# Patient Record
Sex: Female | Born: 1995 | Hispanic: Yes | Marital: Single
Health system: Southern US, Community
[De-identification: ages and names within clinical notes are randomized; demographics above are authoritative.]

## PROBLEM LIST (undated history)

## (undated) HISTORY — PX: MOLE REMOVAL: SHX2046

---

## 2018-04-06 DIAGNOSIS — E663 Overweight: Secondary | ICD-10-CM | POA: Insufficient documentation

## 2018-04-06 LAB — HM HIV SCREENING LAB: HM HIV Screening: NEGATIVE

## 2019-02-08 ENCOUNTER — Telehealth: Payer: Self-pay | Admitting: *Deleted

## 2019-02-08 DIAGNOSIS — Z20822 Contact with and (suspected) exposure to covid-19: Secondary | ICD-10-CM

## 2019-02-08 NOTE — Telephone Encounter (Signed)
Assisted by Sussane, Interpreter # [id number]; contacted pt to schedule testing; pt accepted appointment at [facility] site 02/11/2019 at 0830; pt given address, location, and instructions that she and all occupants of her vehicle should wear masks; she verbalized understanding; orders placed per protocol. 

## 2019-02-08 NOTE — Telephone Encounter (Signed)
Glenda Linens, RN from [facility] called to have pt tested for COVID due to exposure; pt address, DOB, and phone number confirmed [phone]; will attempt to contact pt.

## 2019-02-11 ENCOUNTER — Other Ambulatory Visit: Payer: Self-pay

## 2019-02-11 DIAGNOSIS — Z20822 Contact with and (suspected) exposure to covid-19: Secondary | ICD-10-CM

## 2019-05-17 ENCOUNTER — Ambulatory Visit: Payer: Self-pay

## 2019-05-31 DIAGNOSIS — E663 Overweight: Secondary | ICD-10-CM

## 2019-06-03 ENCOUNTER — Ambulatory Visit: Payer: Self-pay

## 2019-06-05 ENCOUNTER — Other Ambulatory Visit: Payer: Self-pay

## 2019-06-05 ENCOUNTER — Encounter: Payer: Self-pay | Admitting: Family Medicine

## 2019-06-05 ENCOUNTER — Ambulatory Visit (LOCAL_COMMUNITY_HEALTH_CENTER): Payer: Self-pay | Admitting: Family Medicine

## 2019-06-05 VITALS — BP 110/71 | Ht <= 58 in | Wt 135.0 lb

## 2019-06-05 DIAGNOSIS — Z30017 Encounter for initial prescription of implantable subdermal contraceptive: Secondary | ICD-10-CM

## 2019-06-05 DIAGNOSIS — Z113 Encounter for screening for infections with a predominantly sexual mode of transmission: Secondary | ICD-10-CM

## 2019-06-05 DIAGNOSIS — Z3009 Encounter for other general counseling and advice on contraception: Secondary | ICD-10-CM

## 2019-06-05 MED ORDER — ETONOGESTREL 68 MG ~~LOC~~ IMPL
68.0000 mg | DRUG_IMPLANT | Freq: Once | SUBCUTANEOUS | Status: AC
  Administered 2019-06-05: 12:00:00 68 mg via SUBCUTANEOUS

## 2019-06-05 NOTE — Progress Notes (Signed)
Nexplanon Insertion Procedure Patient identified, informed consent performed, consent signed.   Patient does understand that irregular bleeding is a very common side effect of this medication. She was advised to have backup contraception after placement. Patient was determined to meet WHO criteria for not being pregnant. Appropriate time out taken.  The insertion site was identified 8-10 cm (3-4 inches) from the medial epicondyle of the humerus and 3-5 cm (1.25-2 inches) posterior to (below) the sulcus (groove) between the biceps and triceps muscles of the patient's L arm and marked.  Patient was prepped with alcohol swab and then injected with 3 ml of 1% lidocaine.  Arm was prepped with chlorhexidene, Nexplanon removed from packaging,  Device confirmed in needle, then inserted full length of needle and withdrawn per handbook instructions. Nexplanon was able to palpated in the patient's arm; patient palpated the insert  herself. There was minimal blood loss.  Patient insertion site covered with guaze and a pressure bandage to reduce any bruising.  The patient tolerated the procedure well and was given post procedure instructions.  Nexplanon:   Counseled patient to take OTC analgesic starting as soon as lidocaine starts to wear off and take regularly for at least 48 hr to decrease discomfort.  Specifically to take with food or milk to decrease stomach upset and for IB 600 mg (3 tablets) every 6 hrs; IB 800 mg (4 tablets) every 8 hrs; or Aleve 2 tablets every 12 hrs.

## 2019-06-05 NOTE — Progress Notes (Signed)
Patient in clinic today requesting Nexplanon insertion. Patient had her expired Nexplanon removed 3 months ago in [place] and received Depo. Would like HIV/RPR testing today also. Aileen Fass, RN

## 2019-09-03 ENCOUNTER — Telehealth: Payer: Self-pay | Admitting: Family Medicine

## 2019-09-03 NOTE — Telephone Encounter (Signed)
PATIENT IS HAVING ISSUES WITH NEXPLANON AND WANTS IT REMOVED.

## 2019-09-06 NOTE — Telephone Encounter (Signed)
TC with patient. Reports Neplanon is giving her headaches and wants it removed.  Stated that when she had the last implant removed headaches improved also.  Patient would like device removed and use pills. Appt scheduled. Richmond Campbell, RN

## 2019-09-11 ENCOUNTER — Ambulatory Visit (LOCAL_COMMUNITY_HEALTH_CENTER): Payer: Self-pay | Admitting: Advanced Practice Midwife

## 2019-09-11 ENCOUNTER — Encounter: Payer: Self-pay | Admitting: Advanced Practice Midwife

## 2019-09-11 ENCOUNTER — Other Ambulatory Visit: Payer: Self-pay

## 2019-09-11 VITALS — BP 108/75 | Ht <= 58 in | Wt 143.6 lb

## 2019-09-11 DIAGNOSIS — E6609 Other obesity due to excess calories: Secondary | ICD-10-CM

## 2019-09-11 DIAGNOSIS — Z30011 Encounter for initial prescription of contraceptive pills: Secondary | ICD-10-CM

## 2019-09-11 DIAGNOSIS — E669 Obesity, unspecified: Secondary | ICD-10-CM | POA: Insufficient documentation

## 2019-09-11 DIAGNOSIS — Z3009 Encounter for other general counseling and advice on contraception: Secondary | ICD-10-CM

## 2019-09-11 DIAGNOSIS — G4452 New daily persistent headache (NDPH): Secondary | ICD-10-CM

## 2019-09-11 MED ORDER — NORETHINDRONE 0.35 MG PO TABS: 1.0000 | ORAL_TABLET | Freq: Every day | ORAL | 0 refills | Status: DC

## 2019-09-11 NOTE — Progress Notes (Signed)
   [facility] problem [facility]  [facility]  Subjective:  Latoya Potter is a 24 y.o. G1P1 SHF nonsmoker being seen today for Nexplanon removal due to daily "bad" h/a since Nexplanon inserted on 06/05/19 and has no partner now.  Takes 2 ES Tylenol q 4 hrs for frontal h/a with N&V, sensitivity to light, denies neuro sxs.  LMP 2017.  Last sex 08/24/19`.  Chief Complaint  Patient presents with  . Contraception    Nexplanon removal    HPI Wants Nexplanon removed because c/o daily h/a.  This is her second Nexplanon and she had h/a with her first one as well. Last PE 04/06/18 Does the patient have a current or past history of drug use? No   No components found for: HCV]   Health Maintenance Due  Topic Date Due  . TETANUS/TDAP  05/31/2017  . INFLUENZA VACCINE  03/23/2019  . PAP-Cervical Cytology Screening  06/01/2019  . PAP SMEAR-Modifier  06/01/2019    ROS  The following portions of the patient's history were reviewed and updated as appropriate: allergies, current medications, past family history, past medical history, past social history, past surgical history and problem list. Problem list updated.   See flowsheet for other program required questions.  Objective:   Vitals:   09/11/19 1541  BP: 108/75  Weight: 143 lb 9.6 oz (65.1 kg)  Height: 4\' 10"  (1.473 m)    Physical Exam  n/a  Assessment and Plan:  is a 24 y.o. female presenting to the [facility] Department for a Women's Health problem visit  1. Obesity due to excess calories, unspecified classification, unspecified whether serious comorbidity present 2.  Family planning Micronor #3 I po daily to begin today Counseled abstinance next 7 days Needs pap at next appt .achdNexplanon Removal Patient identified, informed consent performed, consent signed.   Appropriate time out taken. Nexplanon site identified.  Area prepped in usual sterile  fashon. 3 ml of 1% lidocaine with Epinephrine was used to anesthetize the area at the distal end of the implant and along implant site. A small stab incision was made right beside the implant on the distal portion.  The Nexplanon rod was grasped using straight hemostats/manual and removed without difficulty.  There was minimal blood loss. There were no complications.  Steri-strips were applied over the small incision.  A pressure bandage was applied to reduce any bruising.  The patient tolerated the procedure well and was given post procedure instructions.    Nexplanon:   Counseled patient to take OTC analgesic starting as soon as lidocaine starts to wear off and take regularly for at least 48 hr to decrease discomfort.  Specifically to take with food or milk to decrease stomach upset and for IB 600 mg (3 tablets) every 6 hrs; IB 800 mg (4 tablets) every 8 hrs; or Aleve 2 tablets every 12 hrs.     No follow-ups on file.  No future appointments.  [facility], CNM

## 2019-09-11 NOTE — Progress Notes (Signed)
Consent signed, OCP dispensed, condoms given. Provider orders completed.

## 2019-09-11 NOTE — Progress Notes (Signed)
Last sex was about 4 weeks ago. Pt desires nexplanon removal and to start OCP. Pt states she did not have any problems with headaches before getting Nexplanon.

## 2019-12-23 ENCOUNTER — Ambulatory Visit: Payer: Self-pay

## 2020-02-28 ENCOUNTER — Encounter: Payer: Self-pay | Admitting: Advanced Practice Midwife

## 2020-02-28 ENCOUNTER — Ambulatory Visit (LOCAL_COMMUNITY_HEALTH_CENTER): Payer: Self-pay | Admitting: Advanced Practice Midwife

## 2020-02-28 ENCOUNTER — Other Ambulatory Visit: Payer: Self-pay | Admitting: Advanced Practice Midwife

## 2020-02-28 ENCOUNTER — Other Ambulatory Visit: Payer: Self-pay

## 2020-02-28 VITALS — BP 116/79 | Ht <= 58 in | Wt 137.6 lb

## 2020-02-28 DIAGNOSIS — Z3009 Encounter for other general counseling and advice on contraception: Secondary | ICD-10-CM

## 2020-02-28 DIAGNOSIS — Z30013 Encounter for initial prescription of injectable contraceptive: Secondary | ICD-10-CM

## 2020-02-28 LAB — PREGNANCY, URINE: Preg Test, Ur: NEGATIVE

## 2020-02-28 LAB — WET PREP FOR TRICH, YEAST, CLUE
Trichomonas Exam: NEGATIVE
Yeast Exam: NEGATIVE

## 2020-02-28 MED ORDER — MULTIVITAMINS PO CAPS: 1.0000 | ORAL_CAPSULE | Freq: Every day | ORAL | 0 refills | Status: AC

## 2020-02-28 MED ORDER — MEDROXYPROGESTERONE ACETATE 150 MG/ML IM SUSP
150.0000 mg | INTRAMUSCULAR | Status: AC
  Administered 2020-02-28 – 2020-07-29 (×2): 150 mg via INTRAMUSCULAR

## 2020-02-28 NOTE — Progress Notes (Signed)
[employer] results reviewed. No treatment per standing orders indicated. PT negative. Depo given and tolerated well. MVI's and condoms accepted. Tawny Hopping, RN

## 2020-02-28 NOTE — Progress Notes (Signed)
Family Planning Visit- Initial Visit  Subjective:  Latoya Potter is a 24 y.o. SHF G1P1001 exvaper  being seen today for an initial well woman visit and to discuss family planning options.  She is currently using nothing for pregnancy prevention. Patient reports she does not want a pregnancy in the next year.  Patient has the following medical conditions has Overweight; Obesity BMI=30.0; and New daily persistent headache on their problem list.  Chief Complaint  Patient presents with  . Contraception    Physical and Depo    Patient reports last sex yesterday without condom; with current partner x 6 years.  LMP 02/11/20.  Nexplanon inserted 12/01/15 and then 06/05/19 and removed 09/11/19 and was given #3 Micronor then.  Pt states ran out of Micronor 5 mo ago and wants DMPA  Patient denies MJ use  Body mass index is 28.76 kg/m. - Patient is eligible for diabetes screening based on BMI and age >4?  not applicable HA1C ordered? not applicable  Patient reports 1 of partners in last year. Desires STI screening?  Yes  Has patient been screened once for HCV in the past?  No  No results found for: HCVAB  Does the patient have current of drug use, have a partner with drug use, and/or has been incarcerated since last result? No  If yes-- Screen for HCV through [facility]   Does the patient meet criteria for HBV testing? No  Criteria:  -Household, sexual or needle sharing contact with HBV -History of drug use -HIV positive -Those with known Hep C   Health Maintenance Due  Topic Date Due  . Hepatitis C Screening  Never done  . COVID-19 Vaccine (1) Never done  . TETANUS/TDAP  Never done  . PAP-Cervical Cytology Screening  Never done  . PAP SMEAR-Modifier  Never done    Review of Systems  All other systems reviewed and are negative.   The following portions of the patient's history were reviewed and updated as appropriate: allergies, current medications, past  family history, past medical history, past social history, past surgical history and problem list. Problem list updated.   See flowsheet for other program required questions.  Objective:   Vitals:   02/28/20 1552  BP: 116/79  Weight: 137 lb 9.6 oz (62.4 kg)  Height: 4\' 10"  (1.473 m)    Physical Exam Constitutional:      Appearance: Normal appearance. She is normal weight.  HENT:     Head: Normocephalic and atraumatic.     Mouth/Throat:     Mouth: Mucous membranes are moist.  Eyes:     Conjunctiva/sclera: Conjunctivae normal.  Cardiovascular:     Rate and Rhythm: Normal rate and regular rhythm.  Pulmonary:     Effort: Pulmonary effort is normal.     Breath sounds: Normal breath sounds.  Chest:     Breasts:        Right: Normal.        Left: Normal.  Abdominal:     Palpations: Abdomen is soft.     Comments: Soft without masses or tenderness  Genitourinary:    General: Normal vulva.     Exam position: Lithotomy position.     Vagina: Vaginal discharge (white creamy leukorrhea, ph<4.5) present.     Cervix: Normal.     Uterus: Normal.      Adnexa: Right adnexa normal and left adnexa normal.     Rectum: Normal.     Comments: Pap done Musculoskeletal:  General: Normal range of motion.     Cervical back: Normal range of motion and neck supple.  Skin:    General: Skin is warm and dry.  Neurological:     Mental Status: She is alert.  Psychiatric:        Mood and Affect: Mood normal.       Assessment and Plan:  Everardo All is a 24 y.o. female presenting to the [facility] Department for an initial well woman exam/family planning visit  Contraception counseling: Reviewed all forms of birth control options in the tiered based approach. available including abstinence; over the counter/barrier methods; hormonal contraceptive medication including pill, patch, ring, injection,contraceptive implant, ECP; hormonal and nonhormonal IUDs;  permanent sterilization options including vasectomy and the various tubal sterilization modalities. Risks, benefits, and typical effectiveness rates were reviewed.  Questions were answered.  Written information was also given to the patient to review.  Patient desires DMPA, this was prescribed for patient. She will follow up in 11-13 wks for surveillance.  She was told to call with any further questions, or with any concerns about this method of contraception.  Emphasized use of condoms 100% of the time for STI prevention.  Patient was offered ECP. ECP was not accepted by the patient. ECP counseling was not given - see RN documentation  1. Family planning Treat wet mount per standing orders Immunization nurse consult - Syphilis Serology, [place] [facility] - HIV [place] [facility] - Chlamydia/Gonorrhea [place] [facility]  2. Encounter for initial prescription of injectable contraceptive If PT neg today may have DMPA 150 mg IM q 11-13 wks x 1 year per pt request Pt declines ECP Please counsel pt abstinance/backup condoms next 7 days Pt counseled to do PT 03/13/20 and to call us if + - Pregnancy, urine - WET PREP FOR TRICH, YEAST, CLUE     No follow-ups on file.  No future appointments.  Alberteen Spindle, CNM

## 2020-02-28 NOTE — Progress Notes (Signed)
Here today for PE and Depo. Last PE here was 04/06/2018. Had Nexplanon removed 09/11/2019. No Pap Smear records. Wants all STD screening today. Tawny Hopping, RN

## 2020-03-03 LAB — PAP IG (IMAGE GUIDED): PAP Smear Comment: 0

## 2020-07-01 ENCOUNTER — Ambulatory Visit: Payer: Self-pay

## 2020-07-29 ENCOUNTER — Other Ambulatory Visit: Payer: Self-pay

## 2020-07-29 ENCOUNTER — Encounter: Payer: Self-pay | Admitting: Physician Assistant

## 2020-07-29 ENCOUNTER — Ambulatory Visit (LOCAL_COMMUNITY_HEALTH_CENTER): Payer: Self-pay | Admitting: Physician Assistant

## 2020-07-29 VITALS — BP 130/72 | Ht <= 58 in | Wt 144.0 lb

## 2020-07-29 DIAGNOSIS — Z30013 Encounter for initial prescription of injectable contraceptive: Secondary | ICD-10-CM

## 2020-07-29 DIAGNOSIS — Z299 Encounter for prophylactic measures, unspecified: Secondary | ICD-10-CM

## 2020-07-29 DIAGNOSIS — Z3009 Encounter for other general counseling and advice on contraception: Secondary | ICD-10-CM

## 2020-07-29 DIAGNOSIS — Z113 Encounter for screening for infections with a predominantly sexual mode of transmission: Secondary | ICD-10-CM

## 2020-07-29 DIAGNOSIS — Z3042 Encounter for surveillance of injectable contraceptive: Secondary | ICD-10-CM

## 2020-07-29 LAB — PREGNANCY, URINE: Preg Test, Ur: NEGATIVE

## 2020-07-29 LAB — WET PREP FOR TRICH, YEAST, CLUE
Trichomonas Exam: NEGATIVE
Yeast Exam: NEGATIVE

## 2020-07-29 MED ORDER — CLOTRIMAZOLE 1 % VA CREA: 1.0000 | TOPICAL_CREAM | Freq: Every day | VAGINAL | 0 refills | Status: DC

## 2020-07-29 NOTE — Progress Notes (Signed)
UPT is negative today. Wet mount reviewed with provider and is negative as well. Pt received Clotrimazole Vaginal cream per Sadie Haber, PA written and verbal order. Pt received Depo 150mg  IM today per , PA verbal order and per Sadie Haber, CNM order from 02/28/2020. Pt tolerated well. Reminder card of when next Depo will be due given to pt if pt decides to continue with Depo, and pt aware to give 04/30/2020 a call around that time so she can RTC for Depo if pt desires to continue with Depo. Provider orders completed.

## 2020-07-29 NOTE — Progress Notes (Signed)
Pt is here for FP Problem visit and desires UPT, STI screening and has concerns about her breasts having some discharge from nipples and tenderness. Pt's last Depo was 02/28/2020, so pt is 21 weeks and 5 days post per last Depo. Pt reports her periods have never been normal and LMP was ~5 months ago. Pt reports currently using condoms sometimes as BCM but is wanting to get on a birth control method today. Pt reports last sex was ~3 weeks ago without condom.

## 2020-07-31 NOTE — Progress Notes (Signed)
[facility]  [facility]  Subjective:  Everardo All is a 24 y.o. being seen today to talk about [facility] options and desires a STD screening.  Chief Complaint  Patient presents with  . Contraception    UPT and STI screening    HPI  Patient is late for Depo (21w 5 d) and states that she desires to become pregnant within the next year.  Reports that she has had some vaginal itching sometimes that will wake her from sleep.  Denies other symptoms.  Reports last sex was 3 weeks ago.   Does the patient have a current or past history of drug use? No   No components found for: HCV]   Health Maintenance Due  Topic Date Due  . Hepatitis C Screening  Never done  . COVID-19 Vaccine (1) Never done  . TETANUS/TDAP  Never done  . INFLUENZA VACCINE  Never done    Review of Systems  All other systems reviewed and are negative.   The following portions of the patient's history were reviewed and updated as appropriate: allergies, current medications, past family history, past medical history, past social history, past surgical history and problem list. Problem list updated.   See flowsheet for other program required questions.  Objective:   Vitals:   07/29/20 1058  BP: 130/72  Weight: 144 lb (65.3 kg)  Height: 4\' 10"  (1.473 m)    Physical Exam Vitals and nursing note reviewed.  Constitutional:      General: She is not in acute distress.    Appearance: She is normal weight.  HENT:     Head: Normocephalic and atraumatic.  Eyes:     Conjunctiva/sclera: Conjunctivae normal.  Pulmonary:     Effort: Pulmonary effort is normal.  Abdominal:     Palpations: Abdomen is soft. There is no mass.     Tenderness: There is no abdominal tenderness. There is no guarding or rebound.  Genitourinary:    General: Normal vulva.     Rectum: Normal.     Comments: External genitalia/pubic area without nits, lice, edema, erythema,  lesions and inguinal adenopathy. Vagina with normal mucosa and discharge. Cervix without visible lesions. Uterus firm, mobile, nt, no masses, no CMT, no adnexal tenderness or fullness. Musculoskeletal:     Cervical back: Neck supple.  Lymphadenopathy:     Cervical: No cervical adenopathy.  Skin:    General: Skin is warm and dry.  Neurological:     Mental Status: She is alert and oriented to person, place, and time.  Psychiatric:        Mood and Affect: Mood normal.        Behavior: Behavior normal.        Thought Content: Thought content normal.        Judgment: Judgment normal.       Assessment and Plan:  is a 24 y.o. female presenting to the [facility] Department for a Women's Health problem visit  1. Encounter for counseling regarding contraception Counseled patient re:  Risks, benefits and SE of all BCMs. Patient counseled extensively that after Depo wears off, it can take up to 18 months before periods return to regular pattern so it can extend the time it can take for her to get pregnant after Depo stops working. Patient states that she would like to restart Depo today if her PT is negative. - Pregnancy, urine  2. Screening for  STD (sexually transmitted disease) Wet mount sent to lab for reading. Reviewed results of wet mount. - WET PREP FOR TRICH, YEAST, CLUE  3. Surveillance for Depo-Provera contraception Patient may have Depo today per previous order. Enc to use condoms as back up for 2 weeks after shot today.   4. Prophylactic measure Give Clotrimazole 1 % vaginal cream to use 1 app qhs for 7 days in a row to see if that resolves itching. - clotrimazole (CLOTRIMAZOLE-7) 1 % vaginal cream; Place 1 Applicatorful vaginally at bedtime.  Dispense: 45 g; Refill: 0     Return in about 11 weeks (around 10/14/2020) for Depo, annual and PRN.  No future appointments.  Matt Holmes, PA

## 2021-01-20 ENCOUNTER — Encounter: Payer: Self-pay | Admitting: Physician Assistant

## 2021-01-20 ENCOUNTER — Other Ambulatory Visit: Payer: Self-pay

## 2021-01-20 ENCOUNTER — Ambulatory Visit (LOCAL_COMMUNITY_HEALTH_CENTER): Payer: Self-pay | Admitting: Physician Assistant

## 2021-01-20 VITALS — Ht <= 58 in | Wt 145.0 lb

## 2021-01-20 DIAGNOSIS — Z113 Encounter for screening for infections with a predominantly sexual mode of transmission: Secondary | ICD-10-CM

## 2021-01-20 DIAGNOSIS — N739 Female pelvic inflammatory disease, unspecified: Secondary | ICD-10-CM

## 2021-01-20 DIAGNOSIS — Z3009 Encounter for other general counseling and advice on contraception: Secondary | ICD-10-CM

## 2021-01-20 LAB — PREGNANCY, URINE: Preg Test, Ur: NEGATIVE

## 2021-01-20 LAB — WET PREP FOR TRICH, YEAST, CLUE
Trichomonas Exam: NEGATIVE
Yeast Exam: NEGATIVE

## 2021-01-20 MED ORDER — CEFTRIAXONE SODIUM 500 MG IJ SOLR
500.0000 mg | Freq: Once | INTRAMUSCULAR | Status: AC
  Administered 2021-01-20: 500 mg via INTRAMUSCULAR

## 2021-01-20 MED ORDER — AZITHROMYCIN 500 MG PO TABS
1000.0000 mg | ORAL_TABLET | ORAL | Status: AC
  Administered 2021-01-20 – 2021-01-27 (×2): 1000 mg via ORAL

## 2021-01-20 MED ORDER — METRONIDAZOLE 500 MG PO TABS: 500.0000 mg | ORAL_TABLET | Freq: Two times a day (BID) | ORAL | 0 refills | Status: AC

## 2021-01-20 NOTE — Progress Notes (Signed)
[facility] problem [facility]  [facility]  Subjective:  Latoya Potter is a 25 y.o. being seen today for pelvic exam.  Chief Complaint  Patient presents with  . Contraception    Pelvic exam     HPI  Patient reports that she has had lower abdominal pain for about 3 weeks, sometimes resolves and when it comes back has a "pulsing" quality but usually has sharp pains.  Reports that she has had some nausea and has constipation.  Denies changes to bowel or bladder function other than constipation.  Denies fever and chills.  Reports white vaginal discharge without itching, burning or odor.  Reports that she has had some breast tenderness and expressed clear discharge from nipples.  Per chart review, s/p Depo at 25 weeks today.   Does the patient have a current or past history of drug use? No   No components found for: HCV]   Health Maintenance Due  Topic Date Due  . COVID-19 Vaccine (1) Never done  . HPV VACCINES (1 - 2-dose series) Never done  . Hepatitis C Screening  Never done  . TETANUS/TDAP  Never done    Review of Systems  All other systems reviewed and are negative.   The following portions of the patient's history were reviewed and updated as appropriate: allergies, current medications, past family history, past medical history, past social history, past surgical history and problem list. Problem list updated.   See flowsheet for other program required questions.  Objective:   Vitals:   01/20/21 1000  Weight: 145 lb (65.8 kg)  Height: 4\' 10"  (1.473 m)    Physical Exam Vitals and nursing note reviewed.  Constitutional:      General: She is not in acute distress.    Appearance: Normal appearance.  HENT:     Head: Normocephalic and atraumatic.  Eyes:     Conjunctiva/sclera: Conjunctivae normal.  Pulmonary:     Effort: Pulmonary effort is normal.  Abdominal:     Palpations: Abdomen is soft. There is no mass.      Tenderness: There is no abdominal tenderness. There is no guarding or rebound.  Genitourinary:    General: Normal vulva.     Rectum: Normal.     Comments: External genitalia/pubic area without nits, lice, edema, erythema, lesions and inguinal adenopathy. Vagina with normal mucosa and moderate amount of thin, white discharge, pH= 4.5. Cervix without visible lesions, but friable on exam. Uterus firm, mobile, with tenderness to palpation and movement, + CMT, no adnexal tenderness or fullness. Musculoskeletal:     Cervical back: Neck supple. No tenderness.  Lymphadenopathy:     Cervical: No cervical adenopathy.  Skin:    General: Skin is warm and dry.     Findings: No bruising, erythema, lesion or rash.  Neurological:     Mental Status: She is alert and oriented to person, place, and time.  Psychiatric:        Mood and Affect: Mood normal.        Behavior: Behavior normal.        Thought Content: Thought content normal.        Judgment: Judgment normal.       Assessment and Plan:  Latoya Potter is a 25 y.o. female presenting to the [facility] Department for a Women's Health problem visit  1. Encounter for counseling regarding contraception Patient into clinic stating initially that she would like to restart Depo  but changes mind and decides to use condoms with all sex. Pregnancy test is negative today.  Counseled that negative today means that she was not pregnant [redacted] weeks ago. Counseled patient that breast tenderness is most likely due to hormone changes with Depo wearing off and that she should limit stimulation to her breasts. - Pregnancy, urine  2. Screening for STD (sexually transmitted disease) Await test results.  Counseled that RN will call if needs to RTC for further treatment. - WET PREP FOR TRICH, YEAST, CLUE - Chlamydia/Gonorrhea [place] [facility]  3. Pelvic inflammatory disease Treat for PID with Ceftriaxone 500 mg IM, Metronidazole  500 mg #28 1 po BID for 14 days and Azithromycin 1 g po DOT today. Counsel patient no sex until after she and her partner have completed treatment. RTC in 1 week for pelvic recheck and second dose of Azithromycin. Call with questions or concerns. Rec that patient go to ER if has worsening abdominal pain, nausea, vomiting, fever or chills. - cefTRIAXone (ROCEPHIN) injection 500 mg - metroNIDAZOLE (FLAGYL) 500 MG tablet; Take 1 tablet (500 mg total) by mouth 2 (two) times daily for 14 days.  Dispense: 28 tablet; Refill: 0 - azithromycin (ZITHROMAX) tablet 1,000 mg     Return in 1 week (on 01/27/2021) for PID recheck and medication.  Future Appointments  Date Time Provider Department Center  01/27/2021  3:40 PM [facility] PROVIDER [facility] None    Matt Holmes, PA

## 2021-01-20 NOTE — Progress Notes (Signed)
Wet mount and UPT reviewed by provider. Pt treated for PID per provider orders. Provider orders completed.

## 2021-01-20 NOTE — Progress Notes (Signed)
Pt had physical and pap at [facility] 02/28/20. Last depo 07/29/20 in left deltoid; 25.0 weeks post depo today. Pt c/o pain in right lower abdomen, usually while walking; sometimes pain so bad she feels like she could pass out but Tylenol does help. Both breasts are sore. Would like STD screening and would like to start depo if not pregnant.

## 2021-01-27 ENCOUNTER — Other Ambulatory Visit: Payer: Self-pay

## 2021-01-27 ENCOUNTER — Ambulatory Visit: Payer: Self-pay | Admitting: Physician Assistant

## 2021-01-27 DIAGNOSIS — N739 Female pelvic inflammatory disease, unspecified: Secondary | ICD-10-CM

## 2021-01-27 DIAGNOSIS — Z09 Encounter for follow-up examination after completed treatment for conditions other than malignant neoplasm: Secondary | ICD-10-CM

## 2021-01-27 NOTE — Progress Notes (Signed)
1 gram Azithromycin po DOT given per provider orders. Provider orders completed.

## 2021-01-28 NOTE — Progress Notes (Signed)
S:  patient into clinic for PID recheck.  Reports that she is feeling a lot better.  States that she has had diarrhea associated with taking her antibiotics.  Patient asks how/why she got PID. O:  WDWN female in NAD, A&O x 3, normal work of breathing.  Abdomen=soft, nt,no masses, no guarding, or rebound; bimanual=uterus normal size, firm, mobile,nt,no CMT, no adnexal tenderness or fullness. A/P:  1.  PID resolving with treatment. 2.  Counseled patient about PID and that it happens when a bacteria goes through the cervix and into the uterus.  3.  Reassured that it can happen to many women and that she should not douche or otherwise wash inside her vagina to prevent PID. 4.  Give patient second dose of Azithromycin 1 g po DOT today. 5.  Counseled patient to complete antibiotics as directed and that diarrhea is a common SE of antibiotics. 6.  Suggested that patient may want to start an OTC probiotic 2-3 days prior to completing her antibiotics and take for 30 days to help restore gut flora. 7.  No sex until after medicine completed and partner completes treatment. 8.  RTC prn.

## 2021-02-22 ENCOUNTER — Other Ambulatory Visit: Payer: Self-pay

## 2021-02-22 ENCOUNTER — Encounter: Payer: Self-pay | Admitting: *Deleted

## 2021-02-22 DIAGNOSIS — B373 Candidiasis of vulva and vagina: Secondary | ICD-10-CM | POA: Insufficient documentation

## 2021-02-22 DIAGNOSIS — F17290 Nicotine dependence, other tobacco product, uncomplicated: Secondary | ICD-10-CM | POA: Insufficient documentation

## 2021-02-22 DIAGNOSIS — R891 Abnormal level of hormones in specimens from other organs, systems and tissues: Secondary | ICD-10-CM | POA: Insufficient documentation

## 2021-02-22 LAB — COMPREHENSIVE METABOLIC PANEL
ALT: 17 U/L (ref 0–44)
AST: 19 U/L (ref 15–41)
Albumin: 4.3 g/dL (ref 3.5–5.0)
Alkaline Phosphatase: 51 U/L (ref 38–126)
Anion gap: 9 (ref 5–15)
BUN: 12 mg/dL (ref 6–20)
CO2: 22 mmol/L (ref 22–32)
Calcium: 9.3 mg/dL (ref 8.9–10.3)
Chloride: 107 mmol/L (ref 98–111)
Creatinine, Ser: 0.58 mg/dL (ref 0.44–1.00)
GFR, Estimated: >60 mL/min (ref >60)
Glucose, Bld: 98 mg/dL (ref 70–99)
Potassium: 3.6 mmol/L (ref 3.5–5.1)
Sodium: 138 mmol/L (ref 135–145)
Total Bilirubin: 0.6 mg/dL (ref 0.3–1.2)
Total Protein: 7.6 g/dL (ref 6.5–8.1)

## 2021-02-22 LAB — URINALYSIS, COMPLETE (UACMP) WITH MICROSCOPIC
Bilirubin Urine: NEGATIVE
Glucose, UA: NEGATIVE mg/dL
Ketones, ur: NEGATIVE mg/dL
Nitrite: NEGATIVE
Protein, ur: NEGATIVE mg/dL
Specific Gravity, Urine: 1.015 (ref 1.005–1.030)
pH: 6 (ref 5.0–8.0)

## 2021-02-22 LAB — CBC
HCT: 40.2 % (ref 36.0–46.0)
Hemoglobin: 13.8 g/dL (ref 12.0–15.0)
MCH: 29.4 pg (ref 26.0–34.0)
MCHC: 34.3 g/dL (ref 30.0–36.0)
MCV: 85.7 fL (ref 80.0–100.0)
Platelets: 278 10*3/uL (ref 150–400)
RBC: 4.69 MIL/uL (ref 3.87–5.11)
RDW: 12.4 % (ref 11.5–15.5)
WBC: 9.6 10*3/uL (ref 4.0–10.5)
nRBC: 0 % (ref 0.0–0.2)

## 2021-02-22 LAB — POC URINE PREG, ED: Preg Test, Ur: NEGATIVE

## 2021-02-22 NOTE — ED Triage Notes (Signed)
Via spanish interpreter, pt states that she is having pain in her vagina and white discharge with itching. LMP 3 months ago, has irregular periods. Lower midline abdominal pain.

## 2021-02-23 ENCOUNTER — Emergency Department
Admission: EM | Admit: 2021-02-23 | Discharge: 2021-02-23 | Disposition: A | Discharge status: Home / Self Care | Payer: Self-pay | Attending: Emergency Medicine | Admitting: Emergency Medicine

## 2021-02-23 ENCOUNTER — Emergency Department: Payer: Self-pay

## 2021-02-23 DIAGNOSIS — R102 Pelvic and perineal pain: Secondary | ICD-10-CM

## 2021-02-23 DIAGNOSIS — B3731 Acute candidiasis of vulva and vagina: Secondary | ICD-10-CM

## 2021-02-23 DIAGNOSIS — E349 Endocrine disorder, unspecified: Secondary | ICD-10-CM

## 2021-02-23 DIAGNOSIS — R52 Pain, unspecified: Secondary | ICD-10-CM

## 2021-02-23 LAB — WET PREP, GENITAL
Clue Cells Wet Prep HPF POC: NONE SEEN
Sperm: NONE SEEN
Trich, Wet Prep: NONE SEEN

## 2021-02-23 LAB — HCG, QUANTITATIVE, PREGNANCY: hCG, Beta Chain, Quant, S: 111 m[IU]/mL — ABNORMAL HIGH (ref <5)

## 2021-02-23 LAB — CHLAMYDIA/NGC RT PCR (ARMC ONLY)
Chlamydia Tr: NOT DETECTED
N gonorrhoeae: NOT DETECTED

## 2021-02-23 MED ORDER — ACETAMINOPHEN 500 MG PO TABS
1000.0000 mg | ORAL_TABLET | Freq: Once | ORAL | Status: AC
  Administered 2021-02-23: 1000 mg via ORAL
  Filled 2021-02-23: qty 2

## 2021-02-23 MED ORDER — MICONAZOLE NITRATE 2 % VA CREA: 1.0000 | TOPICAL_CREAM | Freq: Every day | VAGINAL | 0 refills | Status: AC

## 2021-02-23 NOTE — ED Notes (Signed)
Patient is resting comfortably. NAD noted, awaiting discharge paperwork.

## 2021-02-23 NOTE — ED Notes (Signed)
Patient return from Ultrasound

## 2021-02-23 NOTE — ED Notes (Signed)
MD at the bedside  

## 2021-02-23 NOTE — ED Notes (Signed)
Pt in ultrasound

## 2021-02-23 NOTE — Discharge Instructions (Addendum)
Su prueba de [employer] es ligeramente positiva, pero la ecografa no muestra un Psychiatrist. Puede ser un embarazo muy temprano o tal vez tuvo un aborto espontneo y los niveles hormonales estn bajando. Por lo tanto, es muy importante que vea a un obstetra/gineclogo en una semana para repetir los anlisis de sangre para determinar si est embarazada o no. Tienes una candidiasis. Utilice la crema vaginal de miconazol 7 das segn lo prescrito para la infeccin por levaduras. Regrese a la sala de emergencias por sangrado vaginal, dolor abdominal, mareos, fiebre.  Your blood pregnancy test is slightly positive but the ultrasound does not show a pregnancy. It may a very early pregnancy or maybe you had a miscarriage and the hormone levels are going down. Therefore it is very important that you see an OB/GYN in a week for repeat blood work to determine if you are pregnant or not.  Your discharge was positive for an yeast infection.  Please use miconazole vaginal cream 7 days as prescribed for the yeast infection.  Return to the emergency room for vaginal bleeding, abdominal pain, dizziness, fever.

## 2021-02-23 NOTE — ED Notes (Signed)
Pelvic cart set-up and at the bedside 

## 2021-02-23 NOTE — ED Provider Notes (Signed)
[facility] Emergency Department Provider Note  ____________________________________________  Time seen: Approximately 1:48 AM  I have reviewed the triage vital signs and the nursing notes.   HISTORY  Chief Complaint No chief complaint on file.   HPI Latoya Potter is a 25 y.o. female with no significant past medical history who presents for evaluation of pelvic pain and vaginal discharge.  Patient reports 2 days of white vaginal discharge, itching, and pelvic pain that she describes as burning.  Also has had mild pressure-like discomfort in the suprapubic region associated with it.  No nausea or vomiting, no fever or chills.   History reviewed. No pertinent past medical history.  Patient Active Problem List   Diagnosis Date Noted   Overweight 04/06/2018    Past Surgical History:  Procedure Laterality Date   MOLE REMOVAL     Age 3 yo removed from head    Prior to Admission medications   Medication Sig Start Date End Date Taking? Authorizing Provider  miconazole (MONISTAT 7) 2 % vaginal cream Place 1 Applicatorful vaginally at bedtime for 7 days. 02/23/21 03/02/21 Yes Dominque Marlin, [place], MD  norethindrone (MICRONOR) 0.35 MG tablet Take 1 tablet (0.35 mg total) by mouth daily. Patient not taking: No sig reported 09/11/19   Alberteen Spindle, CNM    Allergies Patient has no known allergies.  No family history on file.  Social History Social History   Tobacco Use   Smoking status: Some Days    Pack years: 0.00    Types: E-cigarettes   Smokeless tobacco: Never  Substance Use Topics   Alcohol use: Not Currently   Drug use: Never    Review of Systems  Constitutional: Negative for fever. Eyes: Negative for visual changes. ENT: Negative for sore throat. Neck: No neck pain  Cardiovascular: Negative for chest pain. Respiratory: Negative for shortness of breath. Gastrointestinal: Negative for abdominal pain, vomiting or  diarrhea. Genitourinary: Negative for dysuria. + vaginal discharge and itching Musculoskeletal: Negative for back pain. Skin: Negative for rash. Neurological: Negative for headaches, weakness or numbness. Psych: No SI or HI  ____________________________________________   PHYSICAL EXAM:  VITAL SIGNS: ED Triage Vitals  Enc Vitals Group     BP 02/22/21 2229 (!) 145/99     Pulse Rate 02/22/21 2229 (!) 106     Resp 02/22/21 2229 16     Temp 02/22/21 2229 98.4 F (36.9 C)     Temp Source 02/22/21 2229 Oral     SpO2 02/22/21 2229 100 %     Weight --      Height --      Head Circumference --      Peak Flow --      Pain Score 02/22/21 2231 8     Pain Loc --      Pain Edu? --      Excl. in GC? --     Constitutional: Alert and oriented. Well appearing and in no apparent distress. HEENT:      Head: Normocephalic and atraumatic.         Eyes: Conjunctivae are normal. Sclera is non-icteric.       Mouth/Throat: Mucous membranes are moist.       Neck: Supple with no signs of meningismus. Cardiovascular: Regular rate and rhythm. No murmurs, gallops, or rubs.  Respiratory: Normal respiratory effort. Lungs are clear to auscultation bilaterally.  Gastrointestinal: Soft, non tender, and non distended with positive bowel sounds. No rebound or guarding. Pelvic exam:  Normal external genitalia, no rashes or lesions. Thick white discharge. Os closed. No cervical motion tenderness.  No uterine or adnexal tenderness.    Musculoskeletal:  No edema, cyanosis, or erythema of extremities. Neurologic: Normal speech and language. Face is symmetric. Moving all extremities. No gross focal neurologic deficits are appreciated. Skin: Skin is warm, dry and intact. No rash noted. Psychiatric: Mood and affect are normal. Speech and behavior are normal.  ____________________________________________   LABS (all labs ordered are listed, but only abnormal results are displayed)  Labs Reviewed  WET PREP,  GENITAL - Abnormal; Notable for the following components:      Result Value   Yeast Wet Prep HPF POC PRESENT (*)    WBC, Wet Prep HPF POC MANY (*)    All other components within normal limits  URINALYSIS, COMPLETE (UACMP) WITH MICROSCOPIC - Abnormal; Notable for the following components:   Color, Urine YELLOW (*)    APPearance HAZY (*)    Hgb urine dipstick SMALL (*)    Leukocytes,Ua LARGE (*)    Bacteria, UA RARE (*)    All other components within normal limits  HCG, QUANTITATIVE, PREGNANCY - Abnormal; Notable for the following components:   hCG, Beta Chain, Quant, S 111 (*)    All other components within normal limits  CHLAMYDIA/NGC RT PCR ([facility] ONLY)            COMPREHENSIVE METABOLIC PANEL  CBC  POC URINE PREG, ED   ____________________________________________  EKG  none  ____________________________________________  RADIOLOGY  I have personally reviewed the images performed during this visit and I agree with the Radiologist's read.   Interpretation by Radiologist:  US PELVIC COMPLETE W TRANSVAGINAL AND TORSION R/O  Result Date: 02/23/2021 CLINICAL DATA:  Pelvic pain x2 days EXAM: TRANSABDOMINAL AND TRANSVAGINAL ULTRASOUND OF PELVIS DOPPLER ULTRASOUND OF OVARIES TECHNIQUE: Both transabdominal and transvaginal ultrasound examinations of the pelvis were performed. Transabdominal technique was performed for global imaging of the pelvis including uterus, ovaries, adnexal regions, and pelvic cul-de-sac. It was necessary to proceed with endovaginal exam following the transabdominal exam to visualize the endometrium. Color and duplex Doppler ultrasound was utilized to evaluate blood flow to the ovaries. COMPARISON:  None. FINDINGS: Uterus Measurements: 7.3 x 4.0 x 4.9 cm = volume: 74.6 mL. No fibroids or other mass visualized. Endometrium Thickness: 12 mm.  No focal abnormality visualized. Right ovary Measurements: 3.1 x 1.4 x 2.1 cm = volume: 4.7 mL. Normal appearance/no adnexal mass.  Left ovary Measurements: 5.1 x 3.0 x 2.9 cm = volume: 23.7 mL. Normal appearance/no adnexal mass, noting simple cysts/follicles measuring up to 1.5 cm, benign. Pulsed Doppler evaluation of both ovaries demonstrates normal low-resistance arterial and venous waveforms. Other findings Small volume pelvic fluid, physiologic. IMPRESSION: Negative pelvic ultrasound. No evidence of ovarian torsion. Electronically Signed   By: Charline Bills M.D.   On: 02/23/2021 02:41     ____________________________________________   PROCEDURES  Procedure(s) performed: None Procedures Critical Care performed:  None ____________________________________________   INITIAL IMPRESSION / ASSESSMENT AND PLAN / ED COURSE  25 y.o. female with no significant past medical history who presents for evaluation of vaginal pain, vaginal discharge, and itching x2 days.  We will do a pelvic exam to rule out STD versus yeast infection versus BV.  Will rule out pregnancy.  Will rule out UTI.  _________________________ 5:09 AM on 02/23/2021 ----------------------------------------- hCG slightly elevated at 111.  Transvaginal ultrasound showing no signs of pregnancy.  No CMT or any signs of PID  on exam.  Ultrasound showing no signs of tubo-ovarian abscess.  Pelvic swabs positive for yeast infection.  Since beta quant is slightly positive will treat with topical miconazole x7 days.  Recommended follow-up with OB in 7 days for repeat beta quant analysis to rule out pregnancy.  Discussed my standard return precautions for new or worsening abdominal pain, vaginal bleeding, syncope.      _____________________________________________ Please note:  Patient was evaluated in Emergency Department today for the symptoms described in the history of present illness. Patient was evaluated in the context of the global COVID-19 pandemic, which necessitated consideration that the patient might be at risk for infection with the SARS-CoV-2 virus that  causes COVID-19. Institutional protocols and algorithms that pertain to the evaluation of patients at risk for COVID-19 are in a state of rapid change based on information released by regulatory bodies including the CDC and federal and state organizations. These policies and algorithms were followed during the patient's care in the ED.  Some ED evaluations and interventions may be delayed as a result of limited staffing during the pandemic.   [place] Controlled Substance Database was reviewed by me. ____________________________________________   FINAL CLINICAL IMPRESSION(S) / ED DIAGNOSES   Final diagnoses:  Pain  Yeast infection of the vagina  Elevated serum hCG      NEW MEDICATIONS STARTED DURING THIS VISIT:  ED Discharge Orders          Ordered    miconazole (MONISTAT 7) 2 % vaginal cream  Daily at bedtime        02/23/21 0401             Note:  This document was prepared using Dragon voice recognition software and may include unintentional dictation errors.    Don Perking, [place], MD 02/23/21 [phone]

## 2021-03-04 ENCOUNTER — Ambulatory Visit (LOCAL_COMMUNITY_HEALTH_CENTER): Payer: Self-pay

## 2021-03-04 ENCOUNTER — Other Ambulatory Visit: Payer: Self-pay

## 2021-03-04 VITALS — BP 110/61 | Ht <= 58 in | Wt 146.0 lb

## 2021-03-04 DIAGNOSIS — Z3201 Encounter for pregnancy test, result positive: Secondary | ICD-10-CM

## 2021-03-04 LAB — PREGNANCY, URINE: Preg Test, Ur: POSITIVE — AB

## 2021-03-04 MED ORDER — PRENATAL 27-0.8 MG PO TABS
1.0000 | ORAL_TABLET | Freq: Every day | ORAL | 0 refills | Status: AC
Start: 2021-03-04 — End: 2021-06-12

## 2021-03-04 NOTE — Progress Notes (Signed)
UPT positive. Plans prenatal care at [facility]. To clerk for preadmit. Juliene Pina, interpreter. Jerel Shepherd, RN

## 2021-03-08 ENCOUNTER — Encounter: Payer: Self-pay | Admitting: Obstetrics

## 2021-03-10 ENCOUNTER — Other Ambulatory Visit: Payer: Self-pay

## 2021-03-10 ENCOUNTER — Encounter: Payer: Self-pay | Admitting: Advanced Practice Midwife

## 2021-03-10 ENCOUNTER — Ambulatory Visit: Payer: Self-pay | Admitting: Advanced Practice Midwife

## 2021-03-10 DIAGNOSIS — B379 Candidiasis, unspecified: Secondary | ICD-10-CM

## 2021-03-10 DIAGNOSIS — Z720 Tobacco use: Secondary | ICD-10-CM | POA: Insufficient documentation

## 2021-03-10 DIAGNOSIS — Z113 Encounter for screening for infections with a predominantly sexual mode of transmission: Secondary | ICD-10-CM

## 2021-03-10 DIAGNOSIS — F1010 Alcohol abuse, uncomplicated: Secondary | ICD-10-CM

## 2021-03-10 HISTORY — DX: Alcohol abuse, uncomplicated: F10.10

## 2021-03-10 LAB — WET PREP FOR TRICH, YEAST, CLUE: Trichomonas Exam: NEGATIVE

## 2021-03-10 MED ORDER — CLOTRIMAZOLE 1 % VA CREA: 1.0000 | TOPICAL_CREAM | Freq: Every day | VAGINAL | 0 refills | Status: AC

## 2021-03-10 NOTE — Progress Notes (Signed)
[facility] Department STI clinic/screening visit  Subjective:  Latoya Potter is a 25 y.o. SHF G2P1 exvaper female being seen today for an STI screening visit. The patient reports they do have symptoms.  Patient reports that they do not desire a pregnancy in the next year.   They reported they are not interested in discussing contraception today.  Patient's last menstrual period was 01/21/2021 (exact date). Pt is early pregnant   Patient has the following medical conditions:   Patient Active Problem List   Diagnosis Date Noted   Morbid obesity [facility]) BMI=30.5 03/10/2021    Chief Complaint  Patient presents with   SEXUALLY TRANSMITTED DISEASE    screening    HPI  Patient reports c/o internal and external vaginal itching and pain since 02/22/21. Went to ER 02/23/21 and dx'd with yeast and treated x 7 days; symptoms resolved x 3 days and then returned. LMP 01/2021. Last sex 02/27/21 without condom; with current partner x 9 years; 1 sex partner in last 3 mo. Last MJ age 18. Last ETOH 02/20/21 (6 shots Petrone+5 jello shots+7 beers). Last douched 01/2021.   Last HIV test per patient/review of record was 02/28/20 Patient reports last pap was 02/28/20 neg   See flowsheet for further details and programmatic requirements.    The following portions of the patient's history were reviewed and updated as appropriate: allergies, current medications, past medical history, past social history, past surgical history and problem list.  Objective:  There were no vitals filed for this visit.  Physical Exam Vitals and nursing note reviewed.  Constitutional:      Appearance: Normal appearance. She is obese.  HENT:     Head: Normocephalic and atraumatic.     Mouth/Throat:     Mouth: Mucous membranes are moist.     Pharynx: Oropharynx is clear. No oropharyngeal exudate or posterior oropharyngeal erythema.  Eyes:     Conjunctiva/sclera: Conjunctivae normal.  Pulmonary:      Effort: Pulmonary effort is normal.  Chest:  Breasts:    Right: No axillary adenopathy or supraclavicular adenopathy.     Left: No axillary adenopathy or supraclavicular adenopathy.  Abdominal:     Palpations: Abdomen is soft. There is no mass.     Tenderness: There is no abdominal tenderness. There is no rebound.     Comments: Soft without masses or tenderness  Genitourinary:    General: Normal vulva.     Exam position: Lithotomy position.     Pubic Area: No rash or pubic lice.      Labia:        Right: No rash or lesion.        Left: No rash or lesion.      Vagina: Vaginal discharge (white adherrent leukorrhea, ph<4.5) present. No erythema (sl erythema with itching), bleeding or lesions.     Cervix: Normal.     Uterus: Normal.      Adnexa: Right adnexa normal and left adnexa normal.     Rectum: Normal.  Lymphadenopathy:     Head:     Right side of head: No preauricular or posterior auricular adenopathy.     Left side of head: No preauricular or posterior auricular adenopathy.     Cervical: No cervical adenopathy.     Right cervical: No superficial, deep or posterior cervical adenopathy.    Left cervical: No superficial, deep or posterior cervical adenopathy.     Upper Body:     Right upper body: No  supraclavicular or axillary adenopathy.     Left upper body: No supraclavicular or axillary adenopathy.     Lower Body: No right inguinal adenopathy. No left inguinal adenopathy.  Skin:    General: Skin is warm and dry.     Findings: No rash.  Neurological:     Mental Status: She is alert and oriented to person, place, and time.     Assessment and Plan:  Latoya Potter is a 25 y.o. female presenting to the [facility] Department for STI screening  1. Morbid obesity (HCC) BMI=30.5   2. Screening examination for venereal disease Treat wet mount per standing orders Immunization nurse consult - WET PREP FOR TRICH, YEAST, CLUE -  Chlamydia/Gonorrhea [facility]     No follow-ups on file.  No future appointments.  Alberteen Spindle, CNM

## 2021-03-10 NOTE — Progress Notes (Signed)
Pt here for STD screening.  Wet mount results reviewed, medication dispensed per SO.  Pt declined condoms. Porchea Charrier M Kaya Klausing, RN  

## 2021-03-26 ENCOUNTER — Encounter: Payer: Self-pay | Admitting: Emergency Medicine

## 2021-03-26 ENCOUNTER — Telehealth: Payer: Self-pay | Admitting: Family Medicine

## 2021-03-26 ENCOUNTER — Other Ambulatory Visit: Payer: Self-pay

## 2021-03-26 ENCOUNTER — Emergency Department
Admission: EM | Admit: 2021-03-26 | Discharge: 2021-03-26 | Disposition: A | Discharge status: Home / Self Care | Payer: Self-pay | Attending: Emergency Medicine | Admitting: Emergency Medicine

## 2021-03-26 ENCOUNTER — Emergency Department: Payer: Self-pay

## 2021-03-26 DIAGNOSIS — Z5321 Procedure and treatment not carried out due to patient leaving prior to being seen by health care provider: Secondary | ICD-10-CM | POA: Insufficient documentation

## 2021-03-26 DIAGNOSIS — R109 Unspecified abdominal pain: Secondary | ICD-10-CM

## 2021-03-26 DIAGNOSIS — O209 Hemorrhage in early pregnancy, unspecified: Secondary | ICD-10-CM | POA: Insufficient documentation

## 2021-03-26 DIAGNOSIS — Z3A08 8 weeks gestation of pregnancy: Secondary | ICD-10-CM | POA: Insufficient documentation

## 2021-03-26 DIAGNOSIS — R102 Pelvic and perineal pain: Secondary | ICD-10-CM | POA: Insufficient documentation

## 2021-03-26 DIAGNOSIS — O26899 Other specified pregnancy related conditions, unspecified trimester: Secondary | ICD-10-CM

## 2021-03-26 LAB — POC URINE PREG, ED: Preg Test, Ur: POSITIVE — AB

## 2021-03-26 LAB — HCG, QUANTITATIVE, PREGNANCY: hCG, Beta Chain, Quant, S: 11297 m[IU]/mL — ABNORMAL HIGH (ref <5)

## 2021-03-26 NOTE — ED Notes (Signed)
No answer when called for vitals. 

## 2021-03-26 NOTE — ED Triage Notes (Signed)
Pt reports that she is 2 months pregnant and developed bleeding last night. It was pink but it is getting more red and is having abd cramping.

## 2021-03-26 NOTE — Telephone Encounter (Signed)
Pt states that she is two months pregnant and she is bleeding and her womb hurts. She wants to speak with a nurse.

## 2021-03-26 NOTE — Telephone Encounter (Signed)
TC to patient who states she is two months pregnant and bleeding with "pain in my womb". Patient states she has not started maternity care yet, and counseled to go to the ED to be checked. Patient states understanding and that she will go to ED. [address] [place] #[id number].Marland KitchenBurt Knack, RN

## 2021-03-26 NOTE — ED Notes (Signed)
Pt answered phone, states she left. No answer when called in lobby.

## 2021-03-29 ENCOUNTER — Other Ambulatory Visit: Payer: Self-pay

## 2021-03-29 ENCOUNTER — Emergency Department: Payer: Self-pay

## 2021-03-29 ENCOUNTER — Emergency Department
Admission: EM | Admit: 2021-03-29 | Discharge: 2021-03-29 | Disposition: A | Discharge status: Home / Self Care | Payer: Self-pay | Attending: Emergency Medicine | Admitting: Emergency Medicine

## 2021-03-29 DIAGNOSIS — O0389 Complete or unspecified spontaneous abortion with other complications: Secondary | ICD-10-CM | POA: Insufficient documentation

## 2021-03-29 DIAGNOSIS — O039 Complete or unspecified spontaneous abortion without complication: Secondary | ICD-10-CM

## 2021-03-29 DIAGNOSIS — N9489 Other specified conditions associated with female genital organs and menstrual cycle: Secondary | ICD-10-CM | POA: Insufficient documentation

## 2021-03-29 DIAGNOSIS — O4690 Antepartum hemorrhage, unspecified, unspecified trimester: Secondary | ICD-10-CM

## 2021-03-29 DIAGNOSIS — Z3A09 9 weeks gestation of pregnancy: Secondary | ICD-10-CM | POA: Insufficient documentation

## 2021-03-29 DIAGNOSIS — Z87891 Personal history of nicotine dependence: Secondary | ICD-10-CM | POA: Insufficient documentation

## 2021-03-29 HISTORY — DX: Complete or unspecified spontaneous abortion without complication: O03.9

## 2021-03-29 LAB — CBC WITH DIFFERENTIAL/PLATELET
Abs Immature Granulocytes: 0.02 10*3/uL (ref 0.00–0.07)
Basophils Absolute: 0.1 10*3/uL (ref 0.0–0.1)
Basophils Relative: 1 %
Eosinophils Absolute: 0.3 10*3/uL (ref 0.0–0.5)
Eosinophils Relative: 3 %
HCT: 39.6 % (ref 36.0–46.0)
Hemoglobin: 13.1 g/dL (ref 12.0–15.0)
Immature Granulocytes: 0 %
Lymphocytes Relative: 31 %
Lymphs Abs: 2.6 10*3/uL (ref 0.7–4.0)
MCH: 28.6 pg (ref 26.0–34.0)
MCHC: 33.1 g/dL (ref 30.0–36.0)
MCV: 86.5 fL (ref 80.0–100.0)
Monocytes Absolute: 0.7 10*3/uL (ref 0.1–1.0)
Monocytes Relative: 9 %
Neutro Abs: 4.8 10*3/uL (ref 1.7–7.7)
Neutrophils Relative %: 56 %
Platelets: 299 10*3/uL (ref 150–400)
RBC: 4.58 MIL/uL (ref 3.87–5.11)
RDW: 12.3 % (ref 11.5–15.5)
WBC: 8.5 10*3/uL (ref 4.0–10.5)
nRBC: 0 % (ref 0.0–0.2)

## 2021-03-29 LAB — ABO/RH: ABO/RH(D): B POS

## 2021-03-29 LAB — HCG, QUANTITATIVE, PREGNANCY: hCG, Beta Chain, Quant, S: 4278 m[IU]/mL — ABNORMAL HIGH (ref <5)

## 2021-03-29 NOTE — ED Provider Notes (Signed)
[facility] Emergency Department Provider Note  ____________________________________________   Event Date/Time   First MD Initiated Contact with Patient 03/29/21 [phone]     (approximate)  I have reviewed the triage vital signs and the nursing notes.   HISTORY  Chief Complaint Vaginal Bleeding  The patient and/or family speak(s) Spanish.  They understand they have the right to the use of a hospital interpreter, however at this time they prefer to speak directly with me in Spanish.  They know that they can ask for an interpreter at any time.   HPI Latoya Potter is a 25 y.o. female  G2 P1 at approximately [redacted] weeks gestation who has not yet had her first prenatal visit which is scheduled in September.  She presents tonight for a cute onset and severe vaginal bleeding.  She said that she has had some spotting for a couple of days but tonight the bleeding became heavy including clots and perhaps fetal tissue.  She is having intermittent severe abdominal cramping but no pain at this time.  She denies nausea and vomiting.  She denies fever, sore throat, chest pain.  She has had no recent dysuria.  Nothing in particular makes the symptoms better or worse.     Past Medical History:  Diagnosis Date   Alcohol abuse last use 02/20/21 (6 shots Petrone+7 beers+5 jello shots) 03/10/2021    Patient Active Problem List   Diagnosis Date Noted   Morbid obesity (HCC) BMI=30.5 03/10/2021   Vapes nicotine containing substance 03/10/2021   Alcohol abuse last use 02/20/21 (6 shots Petrone+7 beers+5 jello shots) 03/10/2021    Past Surgical History:  Procedure Laterality Date   MOLE REMOVAL     Age 30 yo removed from head    Prior to Admission medications   Medication Sig Start Date End Date Taking? Authorizing Provider  norethindrone (MICRONOR) 0.35 MG tablet Take 1 tablet (0.35 mg total) by mouth daily. Patient not taking: No sig reported 09/11/19   Alberteen Spindle, CNM  Prenatal Vit-Fe Fumarate-FA (MULTIVITAMIN-PRENATAL) 27-0.8 MG TABS tablet Take 1 tablet by mouth daily at 12 noon. 03/04/21 06/12/21  Federico Flake, MD    Allergies Patient has no known allergies.  No family history on file.  Social History Social History   Tobacco Use   Smoking status: Former    Types: E-cigarettes    Quit date: 02/22/2021    Years since quitting: 0.0   Smokeless tobacco: Never  Vaping Use   Vaping Use: Former   Substances: Nicotine  Substance Use Topics   Alcohol use: Yes    Alcohol/week: 18.0 standard drinks    Types: 7 Cans of beer, 11 Shots of liquor per week    Comment: last use 02/21/2021- 6 shots Petrone+5 jello shots+7 beers   Drug use: Not Currently    Types: Marijuana    Comment: last use age 43    Review of Systems Constitutional: No fever/chills Eyes: No visual changes. ENT: No sore throat. Cardiovascular: Denies chest pain. Respiratory: Denies shortness of breath. Gastrointestinal: No abdominal pain.  No nausea, no vomiting.  No diarrhea.  No constipation. Genitourinary: Vaginal bleeding during pregnancy. Musculoskeletal: Negative for neck pain.  Negative for back pain. Integumentary: Negative for rash. Neurological: Negative for headaches, focal weakness or numbness.   ____________________________________________   PHYSICAL EXAM:  VITAL SIGNS: ED Triage Vitals  Enc Vitals Group     BP 03/29/21 0006 132/85     Pulse Rate 03/29/21  0006 83     Resp 03/29/21 0006 16     Temp 03/29/21 0340 (!) 97.4 F (36.3 C)     Temp Source 03/29/21 0006 Oral     SpO2 03/29/21 0006 100 %     Weight 03/29/21 0007 65.8 kg (145 lb)     Height 03/29/21 0007 1.473 m (4\' 10" )     Head Circumference --      Peak Flow --      Pain Score 03/29/21 0007 10     Pain Loc --      Pain Edu? --      Excl. in GC? --     Constitutional: Alert and oriented.  Eyes: Conjunctivae are normal.  Head: Atraumatic. Nose: No  congestion/rhinnorhea. Mouth/Throat: Patient is wearing a mask. Neck: No stridor.  No meningeal signs.   Cardiovascular: Normal rate, regular rhythm. Good peripheral circulation. Respiratory: Normal respiratory effort.  No retractions. Gastrointestinal: Soft and nontender. No distention.  Genitourinary: Normal external exam.  There is a relatively small amount of blood in the vagina with no clots and no products of conception.  Cervical os appears closed, which was confirmed on bimanual exam.  Nurse was present throughout the exam as chaperone. Musculoskeletal: No lower extremity tenderness nor edema. No gross deformities of extremities. Neurologic:  Normal speech and language. No gross focal neurologic deficits are appreciated.  Skin:  Skin is warm, dry and intact. Psychiatric: Mood and affect are normal. Speech and behavior are normal.  ____________________________________________   LABS (all labs ordered are listed, but only abnormal results are displayed)  Labs Reviewed  HCG, QUANTITATIVE, PREGNANCY - Abnormal; Notable for the following components:      Result Value   hCG, Beta Chain, Quant, S 4,278 (*)    All other components within normal limits  CBC WITH DIFFERENTIAL/PLATELET  ABO/RH   ____________________________________________    RADIOLOGY 11-28-1973, personally viewed and evaluated these images (plain radiographs) as part of my medical decision making, as well as reviewing the written report by the radiologist.  ED MD interpretation:  No evidence of products of conception, absent gestational sac.  Appears consistent with completed abortion.  Official radiology report(s): Marylou Mccoy OB LESS THAN 14 WEEKS WITH OB TRANSVAGINAL  Result Date: 03/29/2021 CLINICAL DATA:  25 year old female with vaginal bleeding in the 1st trimester of pregnancy, and ultrasound findings definitive for failed pregnancy 3 days ago. Quantitative beta HCG has now decreased from 11,00 to 4,000. EXAM:  OBSTETRIC <14 WK 21 AND TRANSVAGINAL OB US TECHNIQUE: Both transabdominal and transvaginal ultrasound examinations were performed for complete evaluation of the gestation as well as the maternal uterus, adnexal regions, and pelvic cul-de-sac. Transvaginal technique was performed to assess early pregnancy. COMPARISON:  03/26/2021. FINDINGS: Intrauterine gestational sac: None. Maternal uterus/adnexae: Fairly bland appearing endometrial thickening now, up to 10 mm (series 2). The left ovary appears within normal limits measuring 3.1 x 1.7 by 2.0 cm with a simple appearing round 15 mm cyst (image 97). The right ovary appears normal measuring 2.9 x 1.7 x 1.8 cm. No pelvic free fluid is identified. IMPRESSION: 1. IUD no longer identified. No retained products of conception are evident. 2. Both ovaries appear normal.  No pelvic free fluid. Electronically Signed   By: 05/26/2021 M.D.   On: 03/29/2021 06:35    ____________________________________________   PROCEDURES   Procedure(s) performed (including Critical Care):  Procedures   ____________________________________________   INITIAL IMPRESSION / MDM / ASSESSMENT AND PLAN / ED  COURSE  As part of my medical decision making, I reviewed the following data within the electronic MEDICAL RECORD NUMBER History obtained from family, Nursing notes reviewed and incorporated, Labs reviewed , Old chart reviewed, and Notes from prior ED visits   Differential diagnosis includes, but is not limited to, spontaneous abortion, inevitable abortion, missed abortion, ectopic pregnancy, subchorionic hemorrhage.  Patient is in no distress and has no abdominal tenderness to palpation.  ABO Rh is pending to determine need for RhoGAM.  Pelvic exam is generally reassuring with no products of conception nor large blood clots present in the os nor the vagina.  Ultrasound pending.  The patient's hCG has dropped since her last ED visit and I suspect that this represents a failed  pregnancy.  Patient understands and agrees with the plan.     Clinical Course as of 03/29/21 1610  [facility] Mar 29, 2021  0533 ABO/RH(D): B POS Performed at [facility], [address] [place], [place] [address]  No indication for RHOgam [CF]  4098 US OB LESS THAN 14 WEEKS WITH OB TRANSVAGINAL Ultrasound suggests completed abortion.  Updated patient.  She is in no distress, occasionally still having some cramping.  Gave usual/customary recommendations and return precautions. [CF]  [phone] Patient understands and agrees with the plan. [CF]    Clinical Course User Index [CF] Loleta Rose, MD     ____________________________________________  FINAL CLINICAL IMPRESSION(S) / ED DIAGNOSES  Final diagnoses:  Spontaneous abortion     MEDICATIONS GIVEN DURING THIS VISIT:  Medications - No data to display   ED Discharge Orders     None        Note:  This document was prepared using Dragon voice recognition software and may include unintentional dictation errors.   Loleta Rose, MD 03/29/21 [phone]

## 2021-03-29 NOTE — ED Triage Notes (Signed)
Information obtained via interpreter rafel [phone]. Pt is [redacted] weeks pregnant and complaining of vaginal bleeding with large clots. Pt states feels like she has passed fetal tissue. Pt states bleeding began on Friday.

## 2021-04-07 ENCOUNTER — Encounter: Payer: Self-pay | Admitting: Physician Assistant

## 2021-04-07 ENCOUNTER — Ambulatory Visit (LOCAL_COMMUNITY_HEALTH_CENTER): Payer: Self-pay | Admitting: Physician Assistant

## 2021-04-07 ENCOUNTER — Other Ambulatory Visit: Payer: Self-pay

## 2021-04-07 VITALS — BP 109/77 | Ht 58.6 in | Wt 144.4 lb

## 2021-04-07 DIAGNOSIS — B373 Candidiasis of vulva and vagina: Secondary | ICD-10-CM

## 2021-04-07 DIAGNOSIS — Z09 Encounter for follow-up examination after completed treatment for conditions other than malignant neoplasm: Secondary | ICD-10-CM

## 2021-04-07 DIAGNOSIS — B3731 Acute candidiasis of vulva and vagina: Secondary | ICD-10-CM

## 2021-04-07 DIAGNOSIS — Z01419 Encounter for gynecological examination (general) (routine) without abnormal findings: Secondary | ICD-10-CM

## 2021-04-07 DIAGNOSIS — O200 Threatened abortion: Secondary | ICD-10-CM

## 2021-04-07 LAB — WET PREP FOR TRICH, YEAST, CLUE
Trichomonas Exam: NEGATIVE
Yeast Exam: NEGATIVE

## 2021-04-07 MED ORDER — CLOTRIMAZOLE 1 % VA CREA: 1.0000 | TOPICAL_CREAM | Freq: Every day | VAGINAL | 0 refills | Status: DC

## 2021-04-07 NOTE — Progress Notes (Signed)
Wet mount reviewed by provider, dispensed vaginal clotrimazole per provider order. Provider orders completed/

## 2021-04-08 ENCOUNTER — Telehealth: Payer: Self-pay

## 2021-04-08 LAB — BETA HCG QUANT (REF LAB): hCG Quant: 20 m[IU]/mL

## 2021-04-08 NOTE — Progress Notes (Signed)
[facility]  Subjective:  Latoya Potter is a 25 y.o. being seen today for follow up visit.  Chief Complaint  Patient presents with   Threatened Miscarriage    Repeat Beta HcG    HPI Patient states that she was seen in the ER and told to follow up here.  States that she went to the ER on 03/29/2021, due to heavy bleeding and cramping.  Reports that she was told that she had a SAB and needed to follow up here for repeat blood work.  Denies current bleeding and cramping today.  Reports a "bad smell" and itching for the last 3 days as well as increased vaginal discharge.  Patient also states that she was told at the ER that her uterus was "weak" and this was the reason for her miscarriage.  Requests wet mount to check for infection today.  Does the patient have a current or past history of drug use? No   No components found for: HCV]   Health Maintenance Due  Topic Date Due   COVID-19 Vaccine (1) Never done   Pneumococcal Vaccine 24-65 Years old (1 - PCV) Never done   HPV VACCINES (1 - 2-dose series) Never done   Hepatitis C Screening  Never done   TETANUS/TDAP  Never done   INFLUENZA VACCINE  03/22/2021    Review of Systems  All other systems reviewed and are negative.  The following portions of the patient's history were reviewed and updated as appropriate: allergies, current medications, past family history, past medical history, past social history, past surgical history and problem list. Problem list updated.   See flowsheet for other program required questions.  Objective:   Vitals:   04/07/21 1320  BP: 109/77  Weight: 144 lb 6.4 oz (65.5 kg)  Height: 4' 10.6" (1.488 m)    Physical Exam Constitutional:      General: She is not in acute distress.    Appearance: Normal appearance.  HENT:     Head: Normocephalic and atraumatic.     Mouth/Throat:     Mouth: Mucous membranes are  moist.     Pharynx: Oropharynx is clear. No oropharyngeal exudate or posterior oropharyngeal erythema.  Eyes:     Conjunctiva/sclera: Conjunctivae normal.  Pulmonary:     Effort: Pulmonary effort is normal.  Abdominal:     Palpations: Abdomen is soft. There is no mass.     Tenderness: There is no abdominal tenderness. There is no guarding or rebound.  Genitourinary:    General: Normal vulva.     Rectum: Normal.     Comments: External genitalia/pubic area without nits, lice, edema, erythema, lesions and inguinal adenopathy. Vagina with normal mucosa and small amount of clumping, white discharge. Cervix without visible lesions. Uterus firm, mobile, nt, no masses, no CMT, no adnexal tenderness or fullness.  Musculoskeletal:     Cervical back: Neck supple. No tenderness.  Skin:    General: Skin is warm and dry.     Findings: No bruising, erythema, lesion or rash.  Neurological:     Mental Status: She is alert and oriented to person, place, and time.  Psychiatric:        Mood and Affect: Mood normal.        Behavior: Behavior normal.        Thought Content: Thought content normal.        Judgment: Judgment normal.  Assessment and Plan:  Latoya Potter is a 25 y.o. female presenting to the [facility] for a Women's Health problem visit  1. Threatened miscarriage Patient seen at that ER and told that she had a SAB. Referred to [facility] for follow up labs. Reviewed ER visit and last HcG level found was in 4,000 range. Counseled patient that she should have at least one normal cycle prior to trying for another pregnancy. Enc condoms with all sex. Enc to make an appointment in [facility] to discuss options if not ready to try for a pregnancy soon. - Beta hCG quant (ref lab)  2. Follow-up exam See above.  3. Visit for pelvic exam Patient with concerns of vaginal discharge, odor and itching. - WET PREP FOR TRICH, YEAST, CLUE  4. Candidal  vulvovaginitis Will treat for Yeast based on exam findings with Clotrimazole 1% vaginal cream 1 app qhs for 7 days. - clotrimazole (CLOTRIMAZOLE-7) 1 % vaginal cream; Place 1 Applicatorful vaginally at bedtime.  Dispense: 45 g; Refill: 0     No follow-ups on file.  Future Appointments  Date Time Provider Department Center  04/22/2021  1:00 PM [facility] NURSE [facility] None    Matt Holmes, PA

## 2021-04-08 NOTE — Telephone Encounter (Signed)
-----   Message from Matt Holmes, [place] sent at 04/08/2021  9:36 AM EDT ----- Patient needs to RTC in 2 weeks for another Beta HcG to make sure the level goes back to 0.  Please inform and schedule her an appointment.

## 2021-04-08 NOTE — Telephone Encounter (Signed)
Phone call to pt with interpreter Salli Real.  Per provider orders, pt counseled about Beta Hcg results. Appt scheduled for 04/22/21 for lab.

## 2021-04-22 ENCOUNTER — Other Ambulatory Visit: Payer: Self-pay

## 2021-04-22 DIAGNOSIS — N159 Renal tubulo-interstitial disease, unspecified: Secondary | ICD-10-CM

## 2021-04-22 HISTORY — DX: Renal tubulo-interstitial disease, unspecified: N15.9

## 2021-04-23 ENCOUNTER — Encounter: Payer: Self-pay | Admitting: Physician Assistant

## 2021-05-03 ENCOUNTER — Other Ambulatory Visit (LOCAL_COMMUNITY_HEALTH_CENTER): Payer: Self-pay

## 2021-05-03 ENCOUNTER — Other Ambulatory Visit: Payer: Self-pay

## 2021-05-03 VITALS — Wt 145.0 lb

## 2021-05-03 DIAGNOSIS — O200 Threatened abortion: Secondary | ICD-10-CM

## 2021-05-03 NOTE — Progress Notes (Signed)
Patient in Nurse Clinic for a Beta Hcg.  Per Unknown Foley, PA order 04/08/2021. Patient was due for repeat Beta Hcg in 2 weeks (9/1).  Patient reports missing her appointment.  Per consult today with Sadie Haber, PA: ok to draw Beta Hcg to check and make sure her values are trending down.  Patient sent to the lab today foe her Beta Hcg. Patient inquired about when she could start OCP's.  I explained to her it would need to be once she is cleared from the Beta Hcg labs, but meanwhile no sex and if she had to, to always use condoms.  Roddie Mc Yemen interpreted the appointment today. Hart Carwin, RN

## 2021-05-03 NOTE — Progress Notes (Signed)
Consulted by RN re: patient situation.  Reviewed RN note and agree that it reflects our discussion and my recommendations. 

## 2021-05-04 LAB — BETA HCG QUANT (REF LAB): hCG Quant: <1 m[IU]/mL

## 2021-05-05 ENCOUNTER — Telehealth: Payer: Self-pay

## 2021-05-05 NOTE — Telephone Encounter (Signed)
Pt notified of normal Beta HCG results and informed to make an appointment to discuss [facility].  Celedonio Miyamoto RN spoke with pt. Berdie Ogren, RN

## 2021-07-20 ENCOUNTER — Encounter: Payer: Self-pay | Admitting: Intensive Care

## 2021-07-20 ENCOUNTER — Other Ambulatory Visit: Payer: Self-pay

## 2021-07-20 ENCOUNTER — Emergency Department
Admission: EM | Admit: 2021-07-20 | Discharge: 2021-07-21 | Disposition: A | Discharge status: Home / Self Care | Payer: Self-pay | Attending: Emergency Medicine | Admitting: Emergency Medicine

## 2021-07-20 DIAGNOSIS — O98511 Other viral diseases complicating pregnancy, first trimester: Secondary | ICD-10-CM | POA: Insufficient documentation

## 2021-07-20 DIAGNOSIS — R000 Tachycardia, unspecified: Secondary | ICD-10-CM | POA: Insufficient documentation

## 2021-07-20 DIAGNOSIS — E860 Dehydration: Secondary | ICD-10-CM

## 2021-07-20 DIAGNOSIS — Z3A01 Less than 8 weeks gestation of pregnancy: Secondary | ICD-10-CM | POA: Insufficient documentation

## 2021-07-20 DIAGNOSIS — O26891 Other specified pregnancy related conditions, first trimester: Secondary | ICD-10-CM | POA: Insufficient documentation

## 2021-07-20 DIAGNOSIS — J101 Influenza due to other identified influenza virus with other respiratory manifestations: Secondary | ICD-10-CM

## 2021-07-20 DIAGNOSIS — O99281 Endocrine, nutritional and metabolic diseases complicating pregnancy, first trimester: Secondary | ICD-10-CM | POA: Insufficient documentation

## 2021-07-20 DIAGNOSIS — R509 Fever, unspecified: Secondary | ICD-10-CM

## 2021-07-20 DIAGNOSIS — Z3401 Encounter for supervision of normal first pregnancy, first trimester: Secondary | ICD-10-CM

## 2021-07-20 DIAGNOSIS — E876 Hypokalemia: Secondary | ICD-10-CM

## 2021-07-20 DIAGNOSIS — Z20822 Contact with and (suspected) exposure to covid-19: Secondary | ICD-10-CM | POA: Insufficient documentation

## 2021-07-20 DIAGNOSIS — R52 Pain, unspecified: Secondary | ICD-10-CM

## 2021-07-20 DIAGNOSIS — Z87891 Personal history of nicotine dependence: Secondary | ICD-10-CM | POA: Insufficient documentation

## 2021-07-20 DIAGNOSIS — R1030 Lower abdominal pain, unspecified: Secondary | ICD-10-CM | POA: Insufficient documentation

## 2021-07-20 LAB — URINALYSIS, COMPLETE (UACMP) WITH MICROSCOPIC
Bilirubin Urine: NEGATIVE
Glucose, UA: NEGATIVE mg/dL
Leukocytes,Ua: NEGATIVE
Nitrite: NEGATIVE
Protein, ur: NEGATIVE mg/dL
Specific Gravity, Urine: 1.015 (ref 1.005–1.030)
pH: 6.5 (ref 5.0–8.0)

## 2021-07-20 LAB — CBC WITH DIFFERENTIAL/PLATELET
Abs Immature Granulocytes: 0.02 10*3/uL (ref 0.00–0.07)
Basophils Absolute: 0 10*3/uL (ref 0.0–0.1)
Basophils Relative: 1 %
Eosinophils Absolute: 0 10*3/uL (ref 0.0–0.5)
Eosinophils Relative: 0 %
HCT: 39.7 % (ref 36.0–46.0)
Hemoglobin: 13.2 g/dL (ref 12.0–15.0)
Immature Granulocytes: 0 %
Lymphocytes Relative: 13 %
Lymphs Abs: 0.7 10*3/uL (ref 0.7–4.0)
MCH: 28.1 pg (ref 26.0–34.0)
MCHC: 33.2 g/dL (ref 30.0–36.0)
MCV: 84.5 fL (ref 80.0–100.0)
Monocytes Absolute: 0.6 10*3/uL (ref 0.1–1.0)
Monocytes Relative: 12 %
Neutro Abs: 4.1 10*3/uL (ref 1.7–7.7)
Neutrophils Relative %: 74 %
Platelets: 246 10*3/uL (ref 150–400)
RBC: 4.7 MIL/uL (ref 3.87–5.11)
RDW: 13.1 % (ref 11.5–15.5)
WBC: 5.4 10*3/uL (ref 4.0–10.5)
nRBC: 0 % (ref 0.0–0.2)

## 2021-07-20 LAB — COMPREHENSIVE METABOLIC PANEL
ALT: 14 U/L (ref 0–44)
AST: 23 U/L (ref 15–41)
Albumin: 4.1 g/dL (ref 3.5–5.0)
Alkaline Phosphatase: 37 U/L — ABNORMAL LOW (ref 38–126)
Anion gap: 6 (ref 5–15)
BUN: 6 mg/dL (ref 6–20)
CO2: 22 mmol/L (ref 22–32)
Calcium: 8.5 mg/dL — ABNORMAL LOW (ref 8.9–10.3)
Chloride: 103 mmol/L (ref 98–111)
Creatinine, Ser: 0.56 mg/dL (ref 0.44–1.00)
GFR, Estimated: >60 mL/min (ref >60)
Glucose, Bld: 124 mg/dL — ABNORMAL HIGH (ref 70–99)
Potassium: 3.3 mmol/L — ABNORMAL LOW (ref 3.5–5.1)
Sodium: 131 mmol/L — ABNORMAL LOW (ref 135–145)
Total Bilirubin: 1 mg/dL (ref 0.3–1.2)
Total Protein: 7.4 g/dL (ref 6.5–8.1)

## 2021-07-20 LAB — RESP PANEL BY RT-PCR (FLU A&B, COVID) ARPGX2
Influenza A by PCR: POSITIVE — AB
Influenza B by PCR: NEGATIVE
SARS Coronavirus 2 by RT PCR: NEGATIVE

## 2021-07-20 LAB — ABO/RH: ABO/RH(D): B POS

## 2021-07-20 LAB — HCG, QUANTITATIVE, PREGNANCY: hCG, Beta Chain, Quant, S: 6535 m[IU]/mL — ABNORMAL HIGH (ref <5)

## 2021-07-20 MED ORDER — SODIUM CHLORIDE 0.9 % IV BOLUS
1000.0000 mL | Freq: Once | INTRAVENOUS | Status: AC
Start: 2021-07-20 — End: 2021-07-21
  Administered 2021-07-20: 1000 mL via INTRAVENOUS

## 2021-07-20 MED ORDER — ACETAMINOPHEN 500 MG PO TABS
1000.0000 mg | ORAL_TABLET | Freq: Once | ORAL | Status: AC
  Administered 2021-07-20: 1000 mg via ORAL

## 2021-07-20 NOTE — ED Provider Notes (Signed)
[facility] Emergency Department Provider Note   ____________________________________________   Event Date/Time   First MD Initiated Contact with Patient 07/20/21 2314     (approximate)  I have reviewed the triage vital signs and the nursing notes.   HISTORY  Chief Complaint Abdominal Pain and Fever    HPI Latoya Potter is a 25 y.o. female who presents to the ED from home with a chief complaint of fever, body aches, cough, congestion and lower abdominal pain.  Symptoms x2 days.  Patient is G3, P1 Ab1 who had a positive home pregnancy test at home.  Think she is approximately 8 weeks by dates.  First OB appointment is next week.  Denies chest pain, shortness of breath, nausea, vomiting, vaginal bleeding, dysuria or diarrhea.      Past Medical History:  Diagnosis Date   Alcohol abuse last use 02/20/21 (6 shots Petrone+7 beers+5 jello shots) 03/10/2021    Patient Active Problem List   Diagnosis Date Noted   Morbid obesity ([place]) BMI=30.5 03/10/2021   Vapes nicotine containing substance 03/10/2021   Alcohol abuse last use 02/20/21 (6 shots Petrone+7 beers+5 jello shots) 03/10/2021    Past Surgical History:  Procedure Laterality Date   MOLE REMOVAL     Age 70 yo removed from head    Prior to Admission medications   Medication Sig Start Date End Date Taking? Authorizing Provider  ondansetron (ZOFRAN-ODT) 4 MG disintegrating tablet Take 1 tablet (4 mg total) by mouth every 8 (eight) hours as needed for nausea or vomiting. 07/21/21  Yes Paulette Blanch, MD  oseltamivir (TAMIFLU) 75 MG capsule Take 1 capsule (75 mg total) by mouth 2 (two) times daily. 07/21/21  Yes Paulette Blanch, MD  clotrimazole (CLOTRIMAZOLE-7) 1 % vaginal cream Place 1 Applicatorful vaginally at bedtime. 04/07/21   Jerene Dilling, PA  norethindrone (MICRONOR) 0.35 MG tablet Take 1 tablet (0.35 mg total) by mouth daily. Patient not taking: No sig reported 09/11/19   Herbie Saxon, CNM    Allergies Patient has no known allergies.  History reviewed. No pertinent family history.  Social History Social History   Tobacco Use   Smoking status: Former    Types: E-cigarettes    Quit date: 02/22/2021    Years since quitting: 0.4   Smokeless tobacco: Never  Vaping Use   Vaping Use: Former   Substances: Nicotine  Substance Use Topics   Alcohol use: Not Currently    Alcohol/week: 18.0 standard drinks    Types: 7 Cans of beer, 11 Shots of liquor per week    Comment: last use 02/21/2021- 6 shots Petrone+5 jello shots+7 beers   Drug use: Not Currently    Types: Marijuana    Comment: last use age 29    Review of Systems  Constitutional: Ulcerative for fever and body aches.   Eyes: No visual changes. ENT: Positive for congestion.  No sore throat. Cardiovascular: Denies chest pain. Respiratory: Positive for cough.  Denies shortness of breath. Gastrointestinal: Positive for lower abdominal pain.  No nausea, no vomiting.  No diarrhea.  No constipation. Genitourinary: Negative for dysuria. Musculoskeletal: Negative for back pain. Skin: Negative for rash. Neurological: Negative for headaches, focal weakness or numbness.   ____________________________________________   PHYSICAL EXAM:  VITAL SIGNS: ED Triage Vitals [07/20/21 1738]  Enc Vitals Group     BP 109/69     Pulse Rate (!) 122     Resp 16     Temp (!)  100.9 F (38.3 C)     Temp Source Oral     SpO2 99 %     Weight 145 lb (65.8 kg)     Height 4\' 9"  (1.448 m)     Head Circumference      Peak Flow      Pain Score 9     Pain Loc      Pain Edu?      Excl. in GC?     Constitutional: Alert and oriented. Well appearing and in no acute distress. Eyes: Conjunctivae are normal. PERRL. EOMI. Head: Atraumatic. Nose: Congestion/rhinnorhea. Mouth/Throat: Mucous membranes are moist.   Neck: No stridor.  Supple neck without meningismus. Cardiovascular: Tachycardic rate, regular rhythm.  Grossly normal heart sounds.  Good peripheral circulation. Respiratory: Normal respiratory effort.  No retractions. Lungs CTAB. Gastrointestinal: Soft and nontender to light or deep palpation. No distention. No abdominal bruits. No CVA tenderness. Musculoskeletal: No lower extremity tenderness nor edema.  No joint effusions. Neurologic:  Normal speech and language. No gross focal neurologic deficits are appreciated. No gait instability. Skin:  Skin is warm, dry and intact. No rash noted.  No petechiae. Psychiatric: Mood and affect are normal. Speech and behavior are normal.  ____________________________________________   LABS (all labs ordered are listed, but only abnormal results are displayed)  Labs Reviewed  RESP PANEL BY RT-PCR (FLU A&B, COVID) ARPGX2 - Abnormal; Notable for the following components:      Result Value   Influenza A by PCR POSITIVE (*)    All other components within normal limits  COMPREHENSIVE METABOLIC PANEL - Abnormal; Notable for the following components:   Sodium 131 (*)    Potassium 3.3 (*)    Glucose, Bld 124 (*)    Calcium 8.5 (*)    Alkaline Phosphatase 37 (*)    All other components within normal limits  HCG, QUANTITATIVE, PREGNANCY - Abnormal; Notable for the following components:   hCG, Beta Chain, Quant, S 6,535 (*)    All other components within normal limits  URINALYSIS, COMPLETE (UACMP) WITH MICROSCOPIC - Abnormal; Notable for the following components:   Color, Urine YELLOW (*)    APPearance CLEAR (*)    Hgb urine dipstick TRACE (*)    Ketones, ur TRACE (*)    Bacteria, UA RARE (*)    All other components within normal limits  CBC WITH DIFFERENTIAL/PLATELET  ABO/RH   ____________________________________________  EKG  None ____________________________________________  RADIOLOGY I, Keiden Deskin J, personally viewed and evaluated these images (plain radiographs) as part of my medical decision making, as well as reviewing the written report  by the radiologist.  ED MD interpretation: Ultrasound demonstrates probable early intrauterine gestational sac but no cardiac activity visualized yet  Official radiology report(s): OB Comp Less 14 Wks  Result Date: 07/21/2021 CLINICAL DATA:  Pelvic pain x2 days. EXAM: OBSTETRIC <14 WK ULTRASOUND TECHNIQUE: Transabdominal ultrasound was performed for evaluation of the gestation as well as the maternal uterus and adnexal regions. COMPARISON:  March 29, 2021 FINDINGS: Intrauterine gestational sac: Single Yolk sac:  Not Visualized. Embryo:  Not Visualized. Cardiac Activity: Not Visualized. Heart Rate: N/A bpm MSD:  7.3 mm   5 w   3 d Subchorionic hemorrhage:  None visualized. Maternal uterus/adnexae: The right and left ovaries are visualized and are normal in appearance. No pelvic free fluid is seen. IMPRESSION: Probable early intrauterine gestational sac, but no yolk sac, fetal pole, or cardiac activity yet visualized. Recommend follow-up quantitative B-HCG levels and follow-up  US in 14 days to assess viability. This recommendation follows SRU consensus guidelines: Diagnostic Criteria for Nonviable Pregnancy Early in the First Trimester. [place] Med [id number]. Electronically Signed   By: Virgina Norfolk M.D.   On: 07/21/2021 00:53    ____________________________________________   PROCEDURES  Procedure(s) performed (including Critical Care):  .1-3 Lead EKG Interpretation Performed by: Paulette Blanch, MD Authorized by: Paulette Blanch, MD     Interpretation: abnormal     ECG rate:  115   ECG rate assessment: tachycardic     Rhythm: sinus tachycardia     Ectopy: none     Conduction: normal   Comments:     Patient placed on cardiac monitor to evaluate for arrhythmias   ____________________________________________   INITIAL IMPRESSION / ASSESSMENT AND PLAN / ED COURSE  As part of my medical decision making, I reviewed the following data within the [place] [facility] notes reviewed and incorporated, Labs reviewed, Old chart reviewed (04/07/2021 [facility] office visit for threatened miscarriage), Radiograph reviewed, and Notes from prior ED visits     25 year old female presenting with viral symptoms, early pregnancy and lower abdominal pain. Differential diagnosis includes, but is not limited to, ovarian cyst, ovarian torsion, acute appendicitis, diverticulitis, urinary tract infection/pyelonephritis, endometriosis, bowel obstruction, colitis, renal colic, gastroenteritis, hernia, fibroids, endometriosis, pregnancy related pain including ectopic pregnancy, etc.   Laboratory results remarkable for Influenza A+, trace ketones and beta hCG 6535.  Repeat oral temperature 100.2 F.  Will administer Tylenol, initiate IV fluid hydration, obtain pelvic ultrasound.  Will reassess.  Clinical Course as of 07/21/21 0344  Wed Jul 21, 2021  0132 Patient resting no acute distress.  Feeling better.  Afebrile, not tachycardic, not tachypneic nor hypoxic. Will replete potassium. Updated her on all test results.  She has an appointment with [facility] next week for her pregnancy.  Will discharge home with prescriptions for Tamiflu and Zofran ODT as needed.  Strict return precautions given.  Patient verbalizes understanding agrees with plan of care. [JS]    Clinical Course User Index [JS] Paulette Blanch, MD     ____________________________________________   FINAL CLINICAL IMPRESSION(S) / ED DIAGNOSES  Final diagnoses:  Fever, unspecified fever cause  Influenza A  Pain  Pregnancy, first, first trimester  Dehydration  Hypokalemia     ED Discharge Orders          Ordered    oseltamivir (TAMIFLU) 75 MG capsule  2 times daily        07/21/21 0112    ondansetron (ZOFRAN-ODT) 4 MG disintegrating tablet  Every 8 hours PRN        07/21/21 0112             Note:  This document was prepared using Dragon voice recognition software and may include unintentional dictation  errors.    Paulette Blanch, MD 07/21/21 [phone]

## 2021-07-20 NOTE — ED Triage Notes (Signed)
Patient c/o fever, cough, and lower abd pain. Positive pregnancy test at home. First OBGYN appointment next week. Believes she is about 2 months pregnant.

## 2021-07-20 NOTE — ED Provider Notes (Signed)
Emergency Medicine Provider Triage Evaluation Note  Latoya Potter , a 25 y.o. female  was evaluated in triage.  Pt complains of lower abdominal pain and changes in vaginal discharge.  Patient is also had cough, fever and nasal congestion.  She believes she is approximately 2 months pregnant.  No vaginal bleeding.  Denies chest pain and chest tightness.  She has not yet established prenatal care.  Review of Systems  Positive: Patient has fever and lower abdominal pain.  Negative: No chest pain or chest tightness.   Physical Exam  BP 109/69 (BP Location: Left Arm)   Pulse (!) 122   Temp (!) 100.9 F (38.3 C) (Oral)   Resp 16   SpO2 99%  Gen:   Awake, no distress   Resp:  Normal effort  MSK:   Moves extremities without difficulty  Other:    Medical Decision Making  Medically screening exam initiated at 5:39 PM.  Appropriate orders placed.  Latoya Potter was informed that the remainder of the evaluation will be completed by another provider, this initial triage assessment does not replace that evaluation, and the importance of remaining in the ED until their evaluation is complete.     Pia Mau [place], PA-C 07/20/21 1740    Delton Prairie, MD 07/20/21 1925

## 2021-07-21 ENCOUNTER — Emergency Department: Payer: Self-pay

## 2021-07-21 MED ORDER — ONDANSETRON 4 MG PO TBDP: 4.0000 mg | ORAL_TABLET | Freq: Three times a day (TID) | ORAL | 0 refills | Status: DC | PRN

## 2021-07-21 MED ORDER — POTASSIUM CHLORIDE CRYS ER 20 MEQ PO TBCR
40.0000 meq | EXTENDED_RELEASE_TABLET | Freq: Once | ORAL | Status: AC
  Administered 2021-07-21: 40 meq via ORAL
  Filled 2021-07-21: qty 2

## 2021-07-21 MED ORDER — OSELTAMIVIR PHOSPHATE 75 MG PO CAPS: 75.0000 mg | ORAL_CAPSULE | Freq: Two times a day (BID) | ORAL | 0 refills | Status: DC

## 2021-07-21 NOTE — Discharge Instructions (Addendum)
1.  Start Tamiflu today and take as prescribed until finished. 2.  You may take Tylenol every 4 hours as needed for fever greater than 100.4 F. 3.  Drink plenty of fluids daily. 4.  You may take Zofran as needed for nausea. 5.  Return to the ER for worsening symptoms, persistent vomiting, difficulty breathing or other concerns.

## 2021-07-26 ENCOUNTER — Other Ambulatory Visit: Payer: Self-pay

## 2021-07-26 ENCOUNTER — Ambulatory Visit (LOCAL_COMMUNITY_HEALTH_CENTER): Payer: Self-pay

## 2021-07-26 VITALS — BP 96/62 | Ht <= 58 in | Wt 143.5 lb

## 2021-07-26 DIAGNOSIS — Z3201 Encounter for pregnancy test, result positive: Secondary | ICD-10-CM

## 2021-07-26 LAB — PREGNANCY, URINE: Preg Test, Ur: POSITIVE — AB

## 2021-07-26 MED ORDER — PRENATAL 27-0.8 MG PO TABS: 1.0000 | ORAL_TABLET | Freq: Every day | ORAL | 0 refills | Status: AC

## 2021-07-26 NOTE — Progress Notes (Signed)
UPT positive. Plans prenatal care at [facility]. Pt reports being seen for flu at [facility] on 07/20/2021. To clerk for preadmit. Juliene Pina, interpreter. Jerel Shepherd, RN

## 2021-08-02 ENCOUNTER — Other Ambulatory Visit: Payer: Self-pay

## 2021-08-02 ENCOUNTER — Ambulatory Visit: Payer: Medicaid Other | Admitting: Advanced Practice Midwife

## 2021-08-02 DIAGNOSIS — J101 Influenza due to other identified influenza virus with other respiratory manifestations: Secondary | ICD-10-CM

## 2021-08-02 DIAGNOSIS — O0991 Supervision of high risk pregnancy, unspecified, first trimester: Secondary | ICD-10-CM | POA: Diagnosis not present

## 2021-08-02 DIAGNOSIS — Z62810 Personal history of physical and sexual abuse in childhood: Secondary | ICD-10-CM | POA: Insufficient documentation

## 2021-08-02 DIAGNOSIS — F1010 Alcohol abuse, uncomplicated: Secondary | ICD-10-CM

## 2021-08-02 DIAGNOSIS — Z7289 Other problems related to lifestyle: Secondary | ICD-10-CM | POA: Insufficient documentation

## 2021-08-02 DIAGNOSIS — Z23 Encounter for immunization: Secondary | ICD-10-CM | POA: Diagnosis not present

## 2021-08-02 HISTORY — DX: Personal history of physical and sexual abuse in childhood: Z62.810

## 2021-08-02 HISTORY — DX: Other problems related to lifestyle: Z72.89

## 2021-08-02 LAB — URINALYSIS
Bilirubin, UA: NEGATIVE
Ketones, UA: NEGATIVE
Leukocytes,UA: NEGATIVE
Nitrite, UA: NEGATIVE
Protein,UA: NEGATIVE
RBC, UA: NEGATIVE
Specific Gravity, UA: 1.015 (ref 1.005–1.030)
Urobilinogen, Ur: 0.2 mg/dL (ref 0.2–1.0)
pH, UA: 7.5 (ref 5.0–7.5)

## 2021-08-02 LAB — WET PREP FOR TRICH, YEAST, CLUE
Trichomonas Exam: NEGATIVE
Yeast Exam: NEGATIVE

## 2021-08-02 LAB — HEMOGLOBIN, FINGERSTICK: Hemoglobin: 13.1 g/dL (ref 11.1–15.9)

## 2021-08-02 NOTE — Progress Notes (Signed)
Presents to clinic for initiation of prenatal care. Taking PNV daily. To [facility] ED 07/20/2021 due to flu-like symptoms.  A test positive. [place] also done at appt[place] ~ 10 years. Jossie Ng, RN Dating / viability [place] scheduled for 08/04/2021 at 1245 at [facility] in [place]. Appt reminder card with facility name / address given to client. In house labs reviewed and no interventions required per standing order. Jossie Ng, RN
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
## 2021-08-03 LAB — COMPREHENSIVE METABOLIC PANEL
ALT: 22 IU/L (ref 0–32)
AST: 14 IU/L (ref 0–40)
Albumin/Globulin Ratio: 1.8 (ref 1.2–2.2)
Albumin: 4.6 g/dL (ref 3.9–5.0)
Alkaline Phosphatase: 49 IU/L (ref 44–121)
BUN/Creatinine Ratio: 9 (ref 9–23)
BUN: 6 mg/dL (ref 6–20)
Bilirubin Total: 0.7 mg/dL (ref 0.0–1.2)
CO2: 20 mmol/L (ref 20–29)
Calcium: 9.3 mg/dL (ref 8.7–10.2)
Chloride: 105 mmol/L (ref 96–106)
Creatinine, Ser: 0.66 mg/dL (ref 0.57–1.00)
Globulin, Total: 2.6 g/dL (ref 1.5–4.5)
Glucose: 70 mg/dL (ref 70–99)
Potassium: 3.7 mmol/L (ref 3.5–5.2)
Sodium: 142 mmol/L (ref 134–144)
Total Protein: 7.2 g/dL (ref 6.0–8.5)
eGFR: 126 mL/min/{1.73_m2} (ref >59)

## 2021-08-03 LAB — PROTEIN / CREATININE RATIO, URINE
Creatinine, Urine: 71.4 mg/dL
Protein, Ur: 8.1 mg/dL
Protein/Creat Ratio: 113 mg/g creat (ref 0–200)

## 2021-08-03 LAB — CBC/D/PLT+RPR+RH+ABO+RUB AB...
Antibody Screen: NEGATIVE
Basophils Absolute: 0 10*3/uL (ref 0.0–0.2)
Basos: 1 %
EOS (ABSOLUTE): 0.1 10*3/uL (ref 0.0–0.4)
Eos: 1 %
Hematocrit: 39.4 % (ref 34.0–46.6)
Hemoglobin: 13.1 g/dL (ref 11.1–15.9)
Hepatitis B Surface Ag: NEGATIVE
Immature Grans (Abs): 0 10*3/uL (ref 0.0–0.1)
Immature Granulocytes: 0 %
Lymphocytes Absolute: 1.4 10*3/uL (ref 0.7–3.1)
Lymphs: 21 %
MCH: 27.7 pg (ref 26.6–33.0)
MCHC: 33.2 g/dL (ref 31.5–35.7)
MCV: 83 fL (ref 79–97)
Monocytes Absolute: 0.6 10*3/uL (ref 0.1–0.9)
Monocytes: 8 %
Neutrophils Absolute: 4.6 10*3/uL (ref 1.4–7.0)
Neutrophils: 69 %
Platelets: 392 10*3/uL (ref 150–450)
RBC: 4.73 x10E6/uL (ref 3.77–5.28)
RDW: 12.9 % (ref 11.7–15.4)
RPR Ser Ql: NONREACTIVE
Rh Factor: POSITIVE
Rubella Antibodies, IGG: 14.3 index (ref >0.99)
Varicella zoster IgG: 1115 index (ref >165)
WBC: 6.7 10*3/uL (ref 3.4–10.8)

## 2021-08-03 LAB — 789231 7+OXYCODONE-BUND
Amphetamines, Urine: NEGATIVE ng/mL
BENZODIAZ UR QL: NEGATIVE ng/mL
Barbiturate screen, urine: NEGATIVE ng/mL
Cannabinoid Quant, Ur: NEGATIVE ng/mL
Cocaine (Metab.): NEGATIVE ng/mL
OPIATE SCREEN URINE: NEGATIVE ng/mL
Oxycodone/Oxymorphone, Urine: NEGATIVE ng/mL
PCP Quant, Ur: NEGATIVE ng/mL

## 2021-08-03 LAB — HGB A1C W/O EAG: Hgb A1c MFr Bld: 5.3 % (ref 4.8–5.6)

## 2021-08-03 LAB — TSH: TSH: 1.35 u[IU]/mL (ref 0.450–4.500)

## 2021-08-03 LAB — BETA HCG QUANT (REF LAB): hCG Quant: 62641 m[IU]/mL

## 2021-08-03 LAB — HCV INTERPRETATION

## 2021-08-03 LAB — GLUCOSE, 1 HOUR GESTATIONAL: Gestational Diabetes Screen: 69 mg/dL — ABNORMAL LOW (ref 70–139)

## 2021-08-03 LAB — LEAD, BLOOD (ADULT >= 16 YRS): Lead-Whole Blood: <1 ug/dL (ref 0.0–3.4)

## 2021-08-03 LAB — HCV AB W REFLEX TO QUANT PCR: HCV Ab: <0.1 s/co ratio (ref 0.0–0.9)

## 2021-08-04 ENCOUNTER — Ambulatory Visit: Payer: Self-pay | Admitting: *Deleted

## 2021-08-04 ENCOUNTER — Other Ambulatory Visit: Payer: Self-pay

## 2021-08-04 ENCOUNTER — Ambulatory Visit: Payer: Self-pay | Attending: Advanced Practice Midwife

## 2021-08-04 ENCOUNTER — Other Ambulatory Visit: Payer: Self-pay | Admitting: *Deleted

## 2021-08-04 VITALS — BP 115/65 | HR 71

## 2021-08-04 DIAGNOSIS — F1010 Alcohol abuse, uncomplicated: Secondary | ICD-10-CM

## 2021-08-04 DIAGNOSIS — O0991 Supervision of high risk pregnancy, unspecified, first trimester: Secondary | ICD-10-CM | POA: Insufficient documentation

## 2021-08-04 DIAGNOSIS — O99211 Obesity complicating pregnancy, first trimester: Secondary | ICD-10-CM

## 2021-08-04 LAB — QUANTIFERON-TB GOLD PLUS
QuantiFERON Mitogen Value: 2.01 IU/mL
QuantiFERON Nil Value: 0.01 IU/mL
QuantiFERON TB1 Ag Value: 0.01 IU/mL
QuantiFERON TB2 Ag Value: 0.01 IU/mL
QuantiFERON-TB Gold Plus: NEGATIVE

## 2021-08-04 LAB — HGB FRACTIONATION CASCADE
Hgb A2: 2.6 % (ref 1.8–3.2)
Hgb A: 97.4 % (ref 96.4–98.8)
Hgb F: 0 % (ref 0.0–2.0)
Hgb S: 0 %

## 2021-08-04 LAB — CHLAMYDIA/GC NAA, CONFIRMATION
Chlamydia trachomatis, NAA: NEGATIVE
Neisseria gonorrhoeae, NAA: NEGATIVE

## 2021-08-04 LAB — HIV-1/HIV-2 QUALITATIVE RNA
HIV-1 RNA, Qualitative: NONREACTIVE
HIV-2 RNA, Qualitative: NONREACTIVE

## 2021-08-05 LAB — URINE CULTURE

## 2021-08-06 ENCOUNTER — Encounter: Payer: Self-pay | Admitting: Advanced Practice Midwife

## 2021-08-30 ENCOUNTER — Other Ambulatory Visit: Payer: Self-pay

## 2021-08-30 ENCOUNTER — Ambulatory Visit: Payer: Self-pay | Admitting: Advanced Practice Midwife

## 2021-08-30 DIAGNOSIS — Z720 Tobacco use: Secondary | ICD-10-CM

## 2021-08-30 DIAGNOSIS — O0991 Supervision of high risk pregnancy, unspecified, first trimester: Secondary | ICD-10-CM

## 2021-08-30 DIAGNOSIS — F1010 Alcohol abuse, uncomplicated: Secondary | ICD-10-CM
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
## 2021-08-30 NOTE — Progress Notes (Signed)
Latoya Potter interpreted the visit today.  Patient interested in the Implant or the IUD as her BCM.  She is aware of her [facility] appointment on 10/27/2021.

## 2021-09-27 ENCOUNTER — Ambulatory Visit: Payer: Self-pay | Admitting: Advanced Practice Midwife

## 2021-09-27 ENCOUNTER — Other Ambulatory Visit: Payer: Self-pay

## 2021-09-27 VITALS — BP 104/68 | HR 66 | Temp 97.7°F | Wt 136.0 lb

## 2021-09-27 DIAGNOSIS — O0991 Supervision of high risk pregnancy, unspecified, first trimester: Secondary | ICD-10-CM

## 2021-09-27 DIAGNOSIS — F1010 Alcohol abuse, uncomplicated: Secondary | ICD-10-CM

## 2021-09-27 DIAGNOSIS — Z720 Tobacco use: Secondary | ICD-10-CM

## 2021-09-27 DIAGNOSIS — O0992 Supervision of high risk pregnancy, unspecified, second trimester: Secondary | ICD-10-CM

## 2021-09-27 NOTE — Progress Notes (Addendum)
Here today for 15.1 week MH RV. Taking PNV QD. Denies ED/hospital visits since last RV. Aware of 11/06/21 Nurse an [place] appt for 2:30 and 2:45 at [facility]. Wants Quad Screen today. Tawny Hopping, RN
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
## 2021-10-03 LAB — HCG, SERUM, QUALITATIVE

## 2021-10-08 LAB — AFP TETRA
DIA Mom Value: 1.26
DIA Value (EIA): 230.41 pg/mL
DSR (By Age)    1 IN: 1078
DSR (Second Trimester) 1 IN: 3868
Gestational Age: 15.1 WEEKS
MSAFP Mom: 0.9
MSAFP: 29.9 ng/mL
MSHCG Mom: 1.19
MSHCG: 74488 m[IU]/mL
Maternal Age At EDD: 23.8 yr
Osb Risk: 10000
T18 (By Age): 1:4199 {titer}
Test Results:: NEGATIVE
Weight: 136 [lb_av]
uE3 Mom: 1.51
uE3 Value: 1.24 ng/mL

## 2021-10-25 ENCOUNTER — Ambulatory Visit: Payer: Self-pay | Admitting: Advanced Practice Midwife

## 2021-10-25 ENCOUNTER — Other Ambulatory Visit: Payer: Self-pay

## 2021-10-25 DIAGNOSIS — F1010 Alcohol abuse, uncomplicated: Secondary | ICD-10-CM

## 2021-10-25 DIAGNOSIS — Z720 Tobacco use: Secondary | ICD-10-CM

## 2021-10-25 DIAGNOSIS — O0991 Supervision of high risk pregnancy, unspecified, first trimester: Secondary | ICD-10-CM

## 2021-10-25 DIAGNOSIS — O0992 Supervision of high risk pregnancy, unspecified, second trimester: Secondary | ICD-10-CM

## 2021-10-25 DIAGNOSIS — B379 Candidiasis, unspecified: Secondary | ICD-10-CM

## 2021-10-25 MED ORDER — CLOTRIMAZOLE 1 % VA CREA: 1.0000 | TOPICAL_CREAM | Freq: Every day | VAGINAL | 0 refills | Status: AC

## 2021-10-25 NOTE — Progress Notes (Signed)
Patient here for MH RV at 19 1/7. Patient states she is taking her PNV and 81 mg Aspirin. Patient aware of [facility] U/S on 10/27/21.Marland KitchenJenetta Downer, RN  ?

## 2021-10-25 NOTE — Progress Notes (Signed)
Wet mount reviewed with provider. Patient treated for yeast per provider VO.Marland KitchenBurt Knack, RN  ?
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
## 2021-10-25 NOTE — Addendum Note (Signed)
Addended by: Jenetta Downer on: 10/25/2021 04:28 PM ? ? Modules accepted: Orders ? ?

## 2021-10-27 ENCOUNTER — Ambulatory Visit: Payer: Self-pay | Admitting: *Deleted

## 2021-10-27 ENCOUNTER — Ambulatory Visit: Payer: Self-pay | Attending: Obstetrics and Gynecology

## 2021-10-27 ENCOUNTER — Other Ambulatory Visit: Payer: Self-pay

## 2021-10-27 ENCOUNTER — Ambulatory Visit: Payer: Self-pay | Attending: Advanced Practice Midwife | Admitting: Maternal & Fetal Medicine

## 2021-10-27 VITALS — BP 109/66 | HR 84

## 2021-10-27 DIAGNOSIS — O99212 Obesity complicating pregnancy, second trimester: Secondary | ICD-10-CM

## 2021-10-27 DIAGNOSIS — Z363 Encounter for antenatal screening for malformations: Secondary | ICD-10-CM

## 2021-10-27 DIAGNOSIS — F1010 Alcohol abuse, uncomplicated: Secondary | ICD-10-CM | POA: Insufficient documentation

## 2021-10-27 DIAGNOSIS — F17290 Nicotine dependence, other tobacco product, uncomplicated: Secondary | ICD-10-CM

## 2021-10-27 DIAGNOSIS — E669 Obesity, unspecified: Secondary | ICD-10-CM

## 2021-10-27 DIAGNOSIS — O99211 Obesity complicating pregnancy, first trimester: Secondary | ICD-10-CM | POA: Insufficient documentation

## 2021-10-27 DIAGNOSIS — O35BXX1 Maternal care for other (suspected) fetal abnormality and damage, fetal cardiac anomalies, fetus 1: Secondary | ICD-10-CM

## 2021-10-27 DIAGNOSIS — Z3A19 19 weeks gestation of pregnancy: Secondary | ICD-10-CM

## 2021-10-27 DIAGNOSIS — O99332 Smoking (tobacco) complicating pregnancy, second trimester: Secondary | ICD-10-CM

## 2021-10-27 DIAGNOSIS — O99312 Alcohol use complicating pregnancy, second trimester: Secondary | ICD-10-CM

## 2021-10-28 ENCOUNTER — Other Ambulatory Visit: Payer: Self-pay | Admitting: *Deleted

## 2021-10-28 DIAGNOSIS — O99212 Obesity complicating pregnancy, second trimester: Secondary | ICD-10-CM

## 2021-10-28 DIAGNOSIS — F1010 Alcohol abuse, uncomplicated: Secondary | ICD-10-CM

## 2021-10-28 DIAGNOSIS — Z362 Encounter for other antenatal screening follow-up: Secondary | ICD-10-CM

## 2021-10-28 NOTE — Progress Notes (Signed)
MFM Brief Note ? ?Ms. Latoya Potter is a 26 yo G3P1 who is here with an EDD of 03/20/22 at the request of Arnetha Courser, CNM ? ?She declined genetic screening.  ? ?Single intrauterine pregnancy here for a detailed anatomy history of alcohol exposure.  ?Normal anatomy with measurements consistent with dates ?There is good fetal movement and amniotic fluid volume ?Suboptimal views of the fetal anatomy were obtained secondary to fetal position. ? ?In particular I explained that I was not able to see the 4 chamber heart, RVOT, 3VV and the cardiac axis appeared abnormal. In such cases there can be an increased risk for cardiac malformations. I was able to see the LVOT, DA and AA. however, given the views of the heart I have placed a referral to [facility] for a fetal echocardiogram. ? ?Ms. Latoya Potter declined genetic screening, if the echocardiogram returns abnormal, then I recommend genetic counseling and offering diagnostic amniocentesis.  ? ?Recommendation: ?Follow up growth in 4 weeks ?Pediactric cardiology appt referral placed at first available.  ? ?I spent 20 minutes with > 50% in face to face consultation and care coordination. ? ?All questions answered. ? ?Novella Olive, MD ?

## 2021-10-29 ENCOUNTER — Other Ambulatory Visit: Payer: Self-pay | Admitting: Advanced Practice Midwife

## 2021-10-29 DIAGNOSIS — O0991 Supervision of high risk pregnancy, unspecified, first trimester: Secondary | ICD-10-CM

## 2021-10-30 IMAGING — US US OB < 14 WEEKS - US OB TV
1 series · 14 of 28 positions shown · non-contrast
Comparison: 03/26/2021.

CLINICAL DATA: 24-year-old female with vaginal bleeding in the 1st
trimester of pregnancy, and ultrasound findings definitive for
failed pregnancy 3 days ago. Quantitative beta HCG has now decreased
from 11,00 to [DATE].

EXAM:
OBSTETRIC <14 WK US AND TRANSVAGINAL OB US
TECHNIQUE: Both transabdominal and transvaginal ultrasound examinations were
performed for complete evaluation of the gestation as well as the
maternal uterus, adnexal regions, and pelvic cul-de-sac.
Transvaginal technique was performed to assess early pregnancy.

[Series 1: us ob less than 14 weeks with ob transvaginal · 14 of 101 slices shown]
[im 4/101]
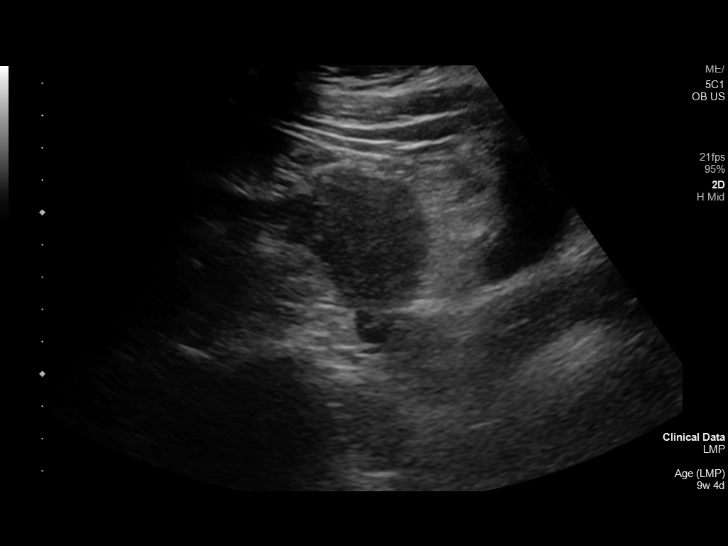
[im 12/101]
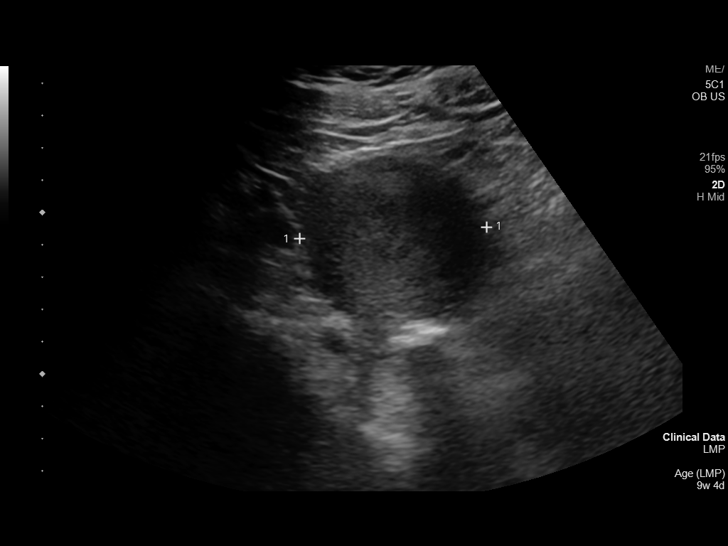
[im 19/101]
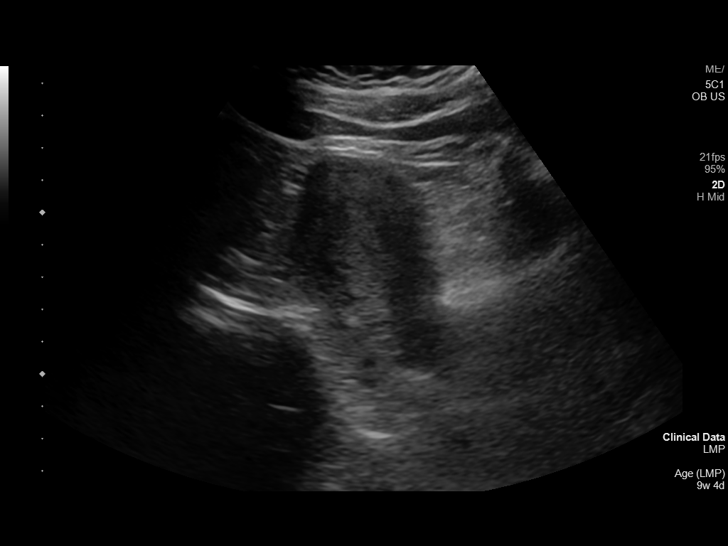
[im 26/101]
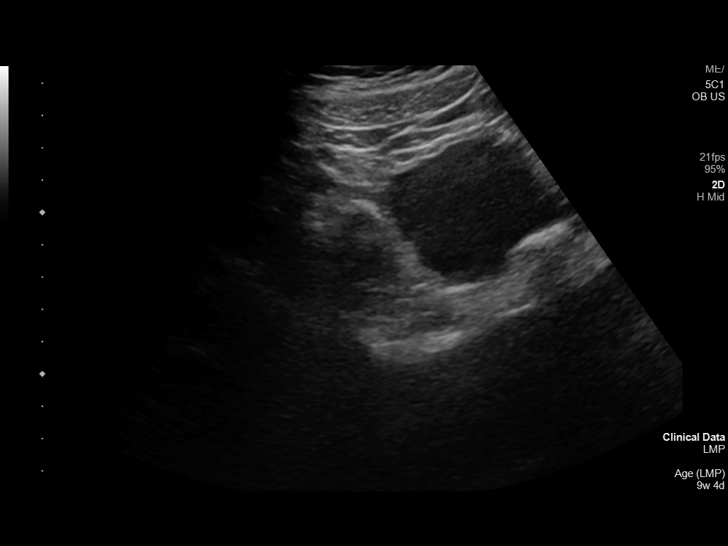
[im 34/101]
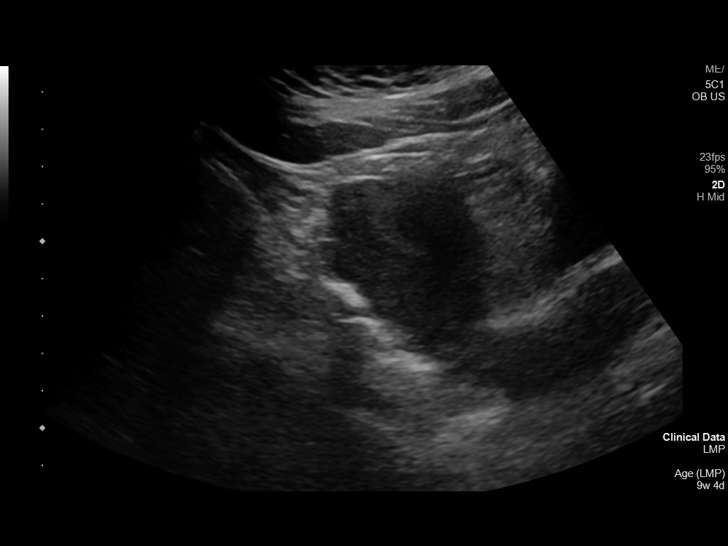
[im 41/101]
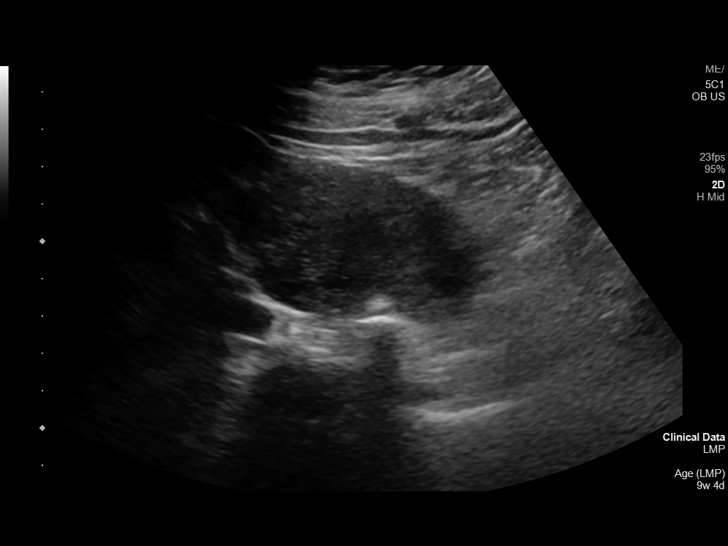
[im 49/101]
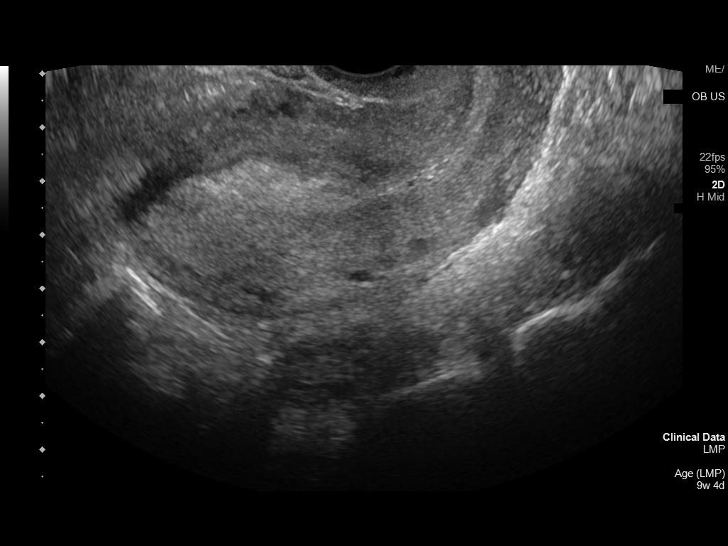
[im 56/101]
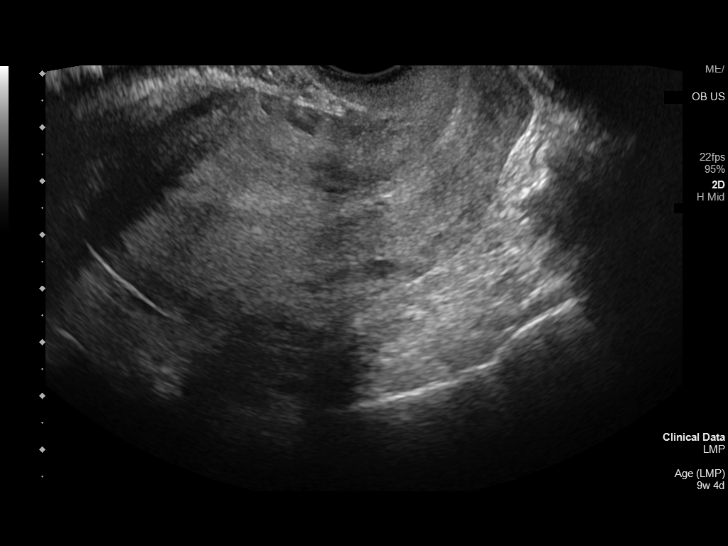
[im 63/101]
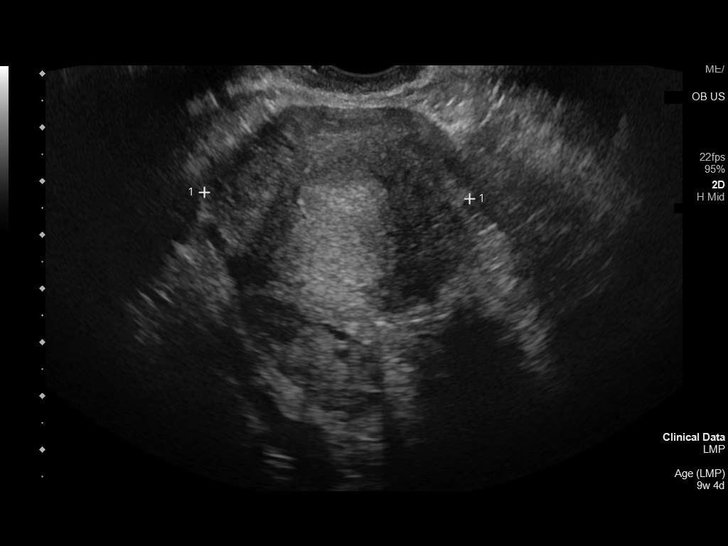
[im 71/101]
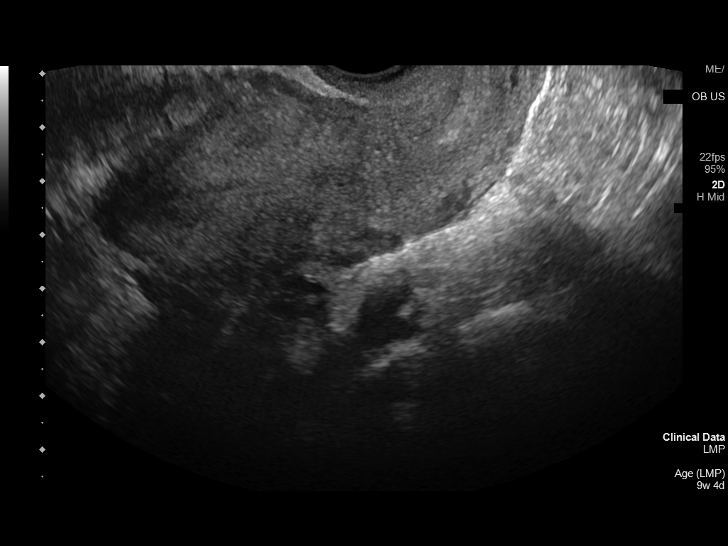
[im 78/101]
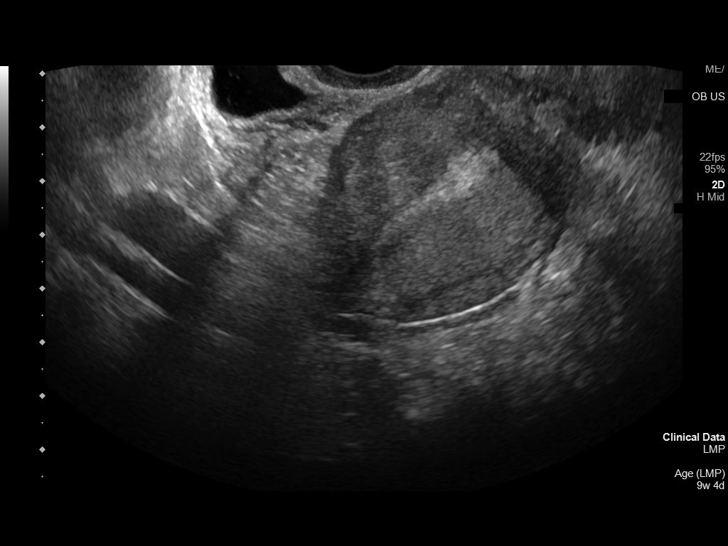
[im 86/101]
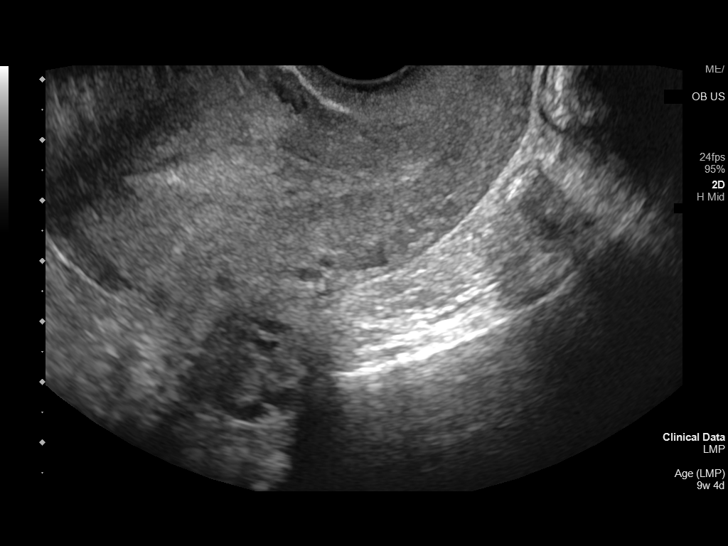
[im 93/101]
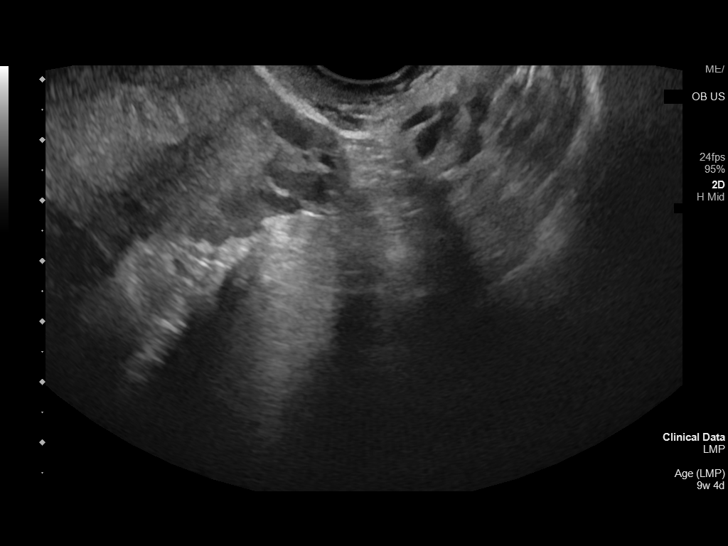
[im 101/101]
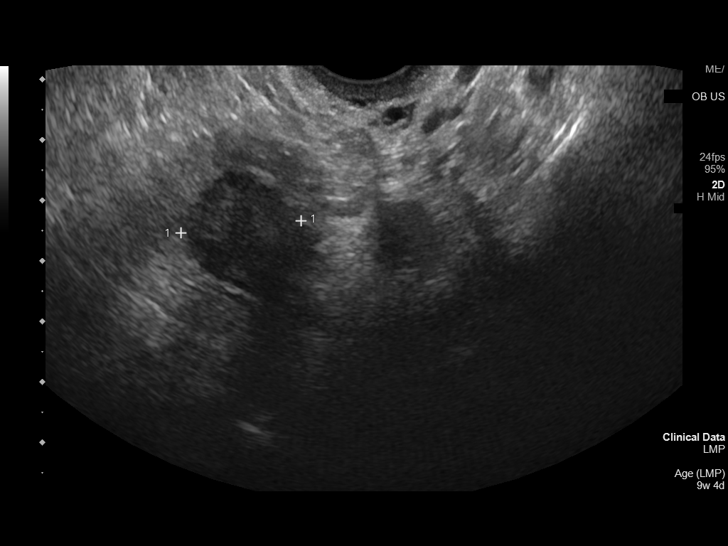

[14 of 28 positions shown; findings below may reference images not displayed]

FINDINGS: Intrauterine gestational sac: None.

Maternal uterus/adnexae: Fairly bland appearing endometrial
thickening now, up to 10 mm (series 2).

The left ovary appears within normal limits measuring 3.1 x 1.7 by
2.0 cm with a simple appearing round 15 mm cyst (image 97). The
right ovary appears normal measuring 2.9 x 1.7 x 1.8 cm. No pelvic
free fluid is identified.
IMPRESSION: 1. IUD no longer identified. No retained products of conception are
evident.
2. Both ovaries appear normal.  No pelvic free fluid.

## 2021-11-15 ENCOUNTER — Observation Stay
Admission: EM | Admit: 2021-11-15 | Discharge: 2021-11-15 | Disposition: A | Discharge status: Home / Self Care | Payer: Self-pay | Attending: Obstetrics | Admitting: Obstetrics

## 2021-11-15 DIAGNOSIS — O26899 Other specified pregnancy related conditions, unspecified trimester: Principal | ICD-10-CM | POA: Insufficient documentation

## 2021-11-15 DIAGNOSIS — O99891 Other specified diseases and conditions complicating pregnancy: Secondary | ICD-10-CM | POA: Diagnosis present

## 2021-11-15 DIAGNOSIS — M546 Pain in thoracic spine: Secondary | ICD-10-CM | POA: Insufficient documentation

## 2021-11-15 DIAGNOSIS — O26892 Other specified pregnancy related conditions, second trimester: Secondary | ICD-10-CM

## 2021-11-15 DIAGNOSIS — Z7982 Long term (current) use of aspirin: Secondary | ICD-10-CM | POA: Insufficient documentation

## 2021-11-15 DIAGNOSIS — O368320 Maternal care for abnormalities of the fetal heart rate or rhythm, second trimester, not applicable or unspecified: Secondary | ICD-10-CM | POA: Insufficient documentation

## 2021-11-15 DIAGNOSIS — M549 Dorsalgia, unspecified: Secondary | ICD-10-CM | POA: Diagnosis present

## 2021-11-15 DIAGNOSIS — Z87891 Personal history of nicotine dependence: Secondary | ICD-10-CM | POA: Insufficient documentation

## 2021-11-15 DIAGNOSIS — Z3A22 22 weeks gestation of pregnancy: Secondary | ICD-10-CM | POA: Insufficient documentation

## 2021-11-15 MED ORDER — ACETAMINOPHEN 325 MG PO TABS
650.0000 mg | ORAL_TABLET | ORAL | Status: DC | PRN
  Administered 2021-11-15: 650 mg via ORAL
  Filled 2021-11-15: qty 2

## 2021-11-15 NOTE — Discharge Summary (Signed)
Please see Final Progress Note. ? ?Mirna Mires, CNM  ?11/15/2021 10:52 PM  ? ?

## 2021-11-15 NOTE — OB Triage Note (Signed)
Pt is a G3P1 at 22.1 with c/o 1 pain that took her breath away x an hour. Felt it in her upper back and upper abd. Pain was sudden and now feels to be lingering. Pt states it doesn't seem to be related to her pregnancy but states she hasnt had this pain before. Describes like a muscle cramp or back spasm. Denies urinary problems. Pt has not had alcohol since October and has not used her vape. Pt was last seen at [facility] about 2-3 weeks ago. Next scheduled appointment in April 3rd.  ?

## 2021-11-15 NOTE — Final Progress Note (Signed)
Final Progress Note ? ?Patient ID: ?[place] ?MRN: [id number] ?DOB/AGE: 24/13/1997 24 y.o. ? ?Admit date: 11/15/2021 ?Admitting provider: Imagene Riches, CNM ?Discharge date: 11/15/2021 ? ? ?Admission Diagnoses: single epidsode of back strain in pregnancy ? ?Discharge Diagnoses:  ?Principal Problem: ?  Back pain affecting pregnancy in second trimester ? Reassuring fetal heart tones ? ?History of Present Illness: The patient is a 26 y.o. female G3P1011 at [redacted]w[redacted]d who presents reporting that earlier in the evening, she noted one momentary, brief pain in her midback when changing positions. She was worried by this and decoded to come to the hospital. She denies any cramping or contractions, denies any dysurai or pelvic pain, or vaginal discharge or bleeding. She feels fetal movement. ? ?Past Medical History:  ?Diagnosis Date  ? Alcohol abuse last use 02/20/21 (6 shots Petrone+7 beers+5 jello shots) 03/10/2021  ? Kidney infection 04/2021  ? Spontaneous abortion 03/29/2021  ? ? ?Past Surgical History:  ?Procedure Laterality Date  ? MOLE REMOVAL    ? Age 49 yo removed from head  ? ? ?No current facility-administered medications on file prior to encounter.  ? ?Current Outpatient Medications on File Prior to Encounter  ?Medication Sig Dispense Refill  ? aspirin EC 81 MG tablet Take 81 mg by mouth daily. Swallow whole.    ? ? ?No Known Allergies ? ?Social History  ? ?Socioeconomic History  ? Marital status: Single  ?  Spouse name: declined  ? Number of children: 1  ? Years of education: 23  ? Highest education level: 11th grade  ?Occupational History  ? Occupation: Public librarian  ?Tobacco Use  ? Smoking status: Former  ?  Types: E-cigarettes  ?  Quit date: 07/08/2021  ?  Years since quitting: 0.3  ?  Passive exposure: Past  ? Smokeless tobacco: Never  ?Vaping Use  ? Vaping Use: Former  ?Substance and Sexual Activity  ? Alcohol use: Not Currently  ?  Alcohol/week: 18.0 standard drinks  ?  Types: 7 Cans of  beer, 11 Shots of liquor per week  ?  Comment: Last use 05/2021. 02/21/2021- 6 shots Petrone + 5 jello shots + 7 beers.  ? Drug use: Not Currently  ?  Types: Marijuana  ?  Comment: Last use age 52 years.  ? Sexual activity: Yes  ?  Partners: Male  ?  Birth control/protection: None  ?  Comment: Last ocp taken 10/2020  - 11/2020.  ?Other Topics Concern  ? Not on file  ?Social History Narrative  ? Lives with sister, 51 year old nephew, daughter.  ? ?Social Determinants of Health  ? ?Financial Resource Strain: Low Risk   ? Difficulty of Paying Living Expenses: Not hard at all  ?Food Insecurity: No Food Insecurity  ? Worried About Charity fundraiser in the Last Year: Never true  ? Ran Out of Food in the Last Year: Never true  ?Transportation Needs: No Transportation Needs  ? Lack of Transportation (Medical): No  ? Lack of Transportation (Non-Medical): No  ?Physical Activity: Not on file  ?Stress: Not on file  ?Social Connections: Not on file  ?Intimate Partner Violence: Not At Risk  ? Fear of Current or Ex-Partner: No  ? Emotionally Abused: No  ? Physically Abused: No  ? Sexually Abused: No  ? ? ?Family History  ?Problem Relation Age of Onset  ? Hypertension Maternal Grandmother   ? Diabetes Paternal Grandmother   ?  ? ?Review of Systems  ?Constitutional:  Negative.   ?HENT: Negative.    ?Eyes: Negative.   ?Respiratory: Negative.    ?Cardiovascular: Negative.   ?Gastrointestinal: Negative.   ?Musculoskeletal:  Positive for back pain.  ?     One episode earlier this evening, now resolved  ?Skin: Negative.   ?Neurological: Negative.   ?Endo/Heme/Allergies: Negative.   ?Psychiatric/Behavioral: Negative.     ? ?Physical Exam: ?BP (!) 96/55   Pulse 72   Temp 98.1 ?F (36.7 ?C) (Oral)   Resp 16   LMP 05/12/2021 (Within Days)   ?Physical Exam ?Constitutional:   ?   Appearance: Normal appearance.  ?Genitourinary:  ?   Genitourinary Comments: deferred  ?Cardiovascular:  ?   Rate and Rhythm: Normal rate and regular rhythm.   ?Pulmonary:  ?   Effort: Pulmonary effort is normal.  ?   Breath sounds: Normal breath sounds.  ?Musculoskeletal:     ?   General: Normal range of motion.  ?   Cervical back: Normal range of motion and neck supple.  ?Neurological:  ?   Mental Status: She is alert.  ?Skin: ?   General: Skin is warm and dry.  ? ? ?Consults: None ? ?Significant Findings/ Diagnostic Studies: none ? ?Procedures: auscultation of fetal heart tones. No NST due to prematurity ?Fetal heart tones per the RN: 140s ? ?Hospital Course: The patient was admitted to Labor and Delivery Triage for observation. She was interviewed by the RN and  FHTS were auscultated. Her abdomen was soft, and per the above Hx of illness, contractions were ruled out. She was provided po Acetaminophen, advised to use a headting pad on her back and , with reassurance, discharged home. She will f;/u at the [facility].  ? ?Discharge Condition: good ? ?Disposition: Discharge disposition: 01-Home or Self Care ? ? ? ? ? ? ?Diet: Regular diet ? ?Discharge Activity: Activity as tolerated ? ?Discharge Instructions   ? ? Notify physician for a general feeling that "something is not right"   Complete by: As directed ?  ? Notify physician for increase or change in vaginal discharge   Complete by: As directed ?  ? Notify physician for intestinal cramps, with or without diarrhea, sometimes described as "gas pain"   Complete by: As directed ?  ? Notify physician for leaking of fluid   Complete by: As directed ?  ? Notify physician for low, dull backache, unrelieved by heat or Tylenol   Complete by: As directed ?  ? Notify physician for menstrual like cramps   Complete by: As directed ?  ? Notify physician for pelvic pressure   Complete by: As directed ?  ? Notify physician for uterine contractions.  These may be painless and feel like the uterus is tightening or the baby is  "balling up"   Complete by: As directed ?  ? Notify physician for vaginal bleeding   Complete by: As directed ?  ?  PRETERM LABOR:  Includes any of the follwing symptoms that occur between 20 - [redacted] weeks gestation.  If these symptoms are not stopped, preterm labor can result in preterm delivery, placing your baby at risk   Complete by: As directed ?  ? ?  ? ?Allergies as of 11/15/2021   ?No Known Allergies ?  ? ?  ?Medication List  ?  ? ?TAKE these medications   ? ?aspirin EC 81 MG tablet ?Take 81 mg by mouth daily. Swallow whole. ?  ? ?  ? ? ? ?Total time spent taking  care of this patient: 15 minutes with chart review and consultation with the triage RN. ? ?Signed: ?Imagene Riches, CNM  ?11/15/2021, 11:03 PM ? ?

## 2021-11-22 ENCOUNTER — Ambulatory Visit: Payer: Self-pay | Admitting: Advanced Practice Midwife

## 2021-11-22 DIAGNOSIS — Z720 Tobacco use: Secondary | ICD-10-CM

## 2021-11-22 DIAGNOSIS — F1010 Alcohol abuse, uncomplicated: Secondary | ICD-10-CM

## 2021-11-22 DIAGNOSIS — O0992 Supervision of high risk pregnancy, unspecified, second trimester: Secondary | ICD-10-CM

## 2021-11-22 DIAGNOSIS — O0991 Supervision of high risk pregnancy, unspecified, first trimester: Secondary | ICD-10-CM

## 2021-11-22 NOTE — Progress Notes (Signed)
Patient here for MH RV at 23 1/7. Had ED visit on 11/15/21 for back pain and abdominal cramps. States still has pain in her back. States she lets hot water run on her back in the shower and that helps. States she has a fetal echo this afternoon. Also has MFM follow up tomorrow at 0830. CMHRP and PHQ9 today.Burt Knack, RN  ?
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
## 2021-11-23 ENCOUNTER — Encounter: Payer: Self-pay | Admitting: *Deleted

## 2021-11-23 ENCOUNTER — Ambulatory Visit: Payer: Self-pay | Attending: Maternal & Fetal Medicine

## 2021-11-23 ENCOUNTER — Ambulatory Visit: Payer: Self-pay | Admitting: *Deleted

## 2021-11-23 VITALS — BP 111/68 | HR 71

## 2021-11-23 DIAGNOSIS — Z3A23 23 weeks gestation of pregnancy: Secondary | ICD-10-CM

## 2021-11-23 DIAGNOSIS — Z362 Encounter for other antenatal screening follow-up: Secondary | ICD-10-CM

## 2021-11-23 DIAGNOSIS — Z6830 Body mass index (BMI) 30.0-30.9, adult: Secondary | ICD-10-CM | POA: Insufficient documentation

## 2021-11-23 DIAGNOSIS — F1010 Alcohol abuse, uncomplicated: Secondary | ICD-10-CM

## 2021-11-23 DIAGNOSIS — O99332 Smoking (tobacco) complicating pregnancy, second trimester: Secondary | ICD-10-CM

## 2021-11-23 DIAGNOSIS — E669 Obesity, unspecified: Secondary | ICD-10-CM

## 2021-11-23 DIAGNOSIS — O99212 Obesity complicating pregnancy, second trimester: Secondary | ICD-10-CM

## 2021-11-23 DIAGNOSIS — O99312 Alcohol use complicating pregnancy, second trimester: Secondary | ICD-10-CM

## 2021-11-24 ENCOUNTER — Other Ambulatory Visit: Payer: Self-pay | Admitting: *Deleted

## 2021-11-24 DIAGNOSIS — Z6830 Body mass index (BMI) 30.0-30.9, adult: Secondary | ICD-10-CM

## 2021-11-24 DIAGNOSIS — O99332 Smoking (tobacco) complicating pregnancy, second trimester: Secondary | ICD-10-CM

## 2021-11-24 DIAGNOSIS — O99310 Alcohol use complicating pregnancy, unspecified trimester: Secondary | ICD-10-CM

## 2021-12-02 LAB — WET PREP FOR TRICH, YEAST, CLUE: Trichomonas Exam: NEGATIVE

## 2021-12-20 ENCOUNTER — Ambulatory Visit: Payer: Self-pay | Admitting: Nurse Practitioner

## 2021-12-20 ENCOUNTER — Encounter: Payer: Self-pay | Admitting: Nurse Practitioner

## 2021-12-20 VITALS — BP 105/69 | HR 75 | Temp 98.0°F | Wt 145.0 lb

## 2021-12-20 DIAGNOSIS — O0993 Supervision of high risk pregnancy, unspecified, third trimester: Secondary | ICD-10-CM

## 2021-12-20 DIAGNOSIS — O99213 Obesity complicating pregnancy, third trimester: Secondary | ICD-10-CM

## 2021-12-20 DIAGNOSIS — Z23 Encounter for immunization: Secondary | ICD-10-CM

## 2021-12-20 DIAGNOSIS — F1010 Alcohol abuse, uncomplicated: Secondary | ICD-10-CM

## 2021-12-20 DIAGNOSIS — Z720 Tobacco use: Secondary | ICD-10-CM

## 2021-12-20 DIAGNOSIS — O0991 Supervision of high risk pregnancy, unspecified, first trimester: Secondary | ICD-10-CM
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
## 2021-12-20 NOTE — Progress Notes (Signed)
Patient here for MH RV at 27 1/7. 28 week labs and Tdap today. Tdap given, tolerated well, VIS and NCIR given. Needs to be to lab by 11:05 for 11:18 blood draw.Burt Knack, RN  ?

## 2021-12-22 LAB — RPR: RPR Ser Ql: NONREACTIVE

## 2021-12-22 LAB — HIV-1/HIV-2 QUALITATIVE RNA
HIV-1 RNA, Qualitative: NONREACTIVE
HIV-2 RNA, Qualitative: NONREACTIVE

## 2021-12-22 LAB — GLUCOSE, 1 HOUR GESTATIONAL: Gestational Diabetes Screen: 65 mg/dL — ABNORMAL LOW (ref 70–139)

## 2022-01-03 ENCOUNTER — Encounter: Payer: Self-pay | Admitting: Nurse Practitioner

## 2022-01-03 ENCOUNTER — Ambulatory Visit: Payer: Self-pay | Admitting: Nurse Practitioner

## 2022-01-03 VITALS — BP 102/58 | HR 80 | Temp 97.7°F | Wt 149.6 lb

## 2022-01-03 DIAGNOSIS — O0991 Supervision of high risk pregnancy, unspecified, first trimester: Secondary | ICD-10-CM

## 2022-01-03 DIAGNOSIS — F1010 Alcohol abuse, uncomplicated: Secondary | ICD-10-CM

## 2022-01-03 DIAGNOSIS — O0993 Supervision of high risk pregnancy, unspecified, third trimester: Secondary | ICD-10-CM

## 2022-01-03 DIAGNOSIS — Z720 Tobacco use: Secondary | ICD-10-CM

## 2022-01-03 DIAGNOSIS — O99213 Obesity complicating pregnancy, third trimester: Secondary | ICD-10-CM

## 2022-01-03 LAB — HEMOGLOBIN, FINGERSTICK: Hemoglobin: 11.8 g/dL (ref 11.1–15.9)

## 2022-01-03 NOTE — Addendum Note (Signed)
Addended by: Veatrice Kells on: 01/03/2022 09:36 AM ? ? Modules accepted: Orders ? ?
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
## 2022-01-03 NOTE — Progress Notes (Signed)
Hgb result from 12/20/2021 not available in Epic and [facility] lab checking for result. Collene Leyden FNP notified. Aware of 01/04/2022 [facility] [place] appt. Rich Number, RN ?Call from [facility] lab and unable to locate 12/20/2021 hgb result. Per verbal order Gregary Cromer FNP, repeat hgb today. Rich Number, RN ?Hgb = 11.8 and no intervention required per standing order. Rich Number, RN ? ? ?

## 2022-01-04 ENCOUNTER — Other Ambulatory Visit: Payer: Self-pay | Admitting: *Deleted

## 2022-01-04 ENCOUNTER — Encounter: Payer: Self-pay | Admitting: *Deleted

## 2022-01-04 ENCOUNTER — Ambulatory Visit: Payer: Self-pay | Attending: Obstetrics

## 2022-01-04 ENCOUNTER — Ambulatory Visit: Payer: Self-pay | Admitting: *Deleted

## 2022-01-04 VITALS — BP 117/64 | HR 98

## 2022-01-04 DIAGNOSIS — O99311 Alcohol use complicating pregnancy, first trimester: Secondary | ICD-10-CM

## 2022-01-04 DIAGNOSIS — E669 Obesity, unspecified: Secondary | ICD-10-CM

## 2022-01-04 DIAGNOSIS — F17210 Nicotine dependence, cigarettes, uncomplicated: Secondary | ICD-10-CM

## 2022-01-04 DIAGNOSIS — O99213 Obesity complicating pregnancy, third trimester: Secondary | ICD-10-CM

## 2022-01-04 DIAGNOSIS — Z3689 Encounter for other specified antenatal screening: Secondary | ICD-10-CM

## 2022-01-04 DIAGNOSIS — Z6830 Body mass index (BMI) 30.0-30.9, adult: Secondary | ICD-10-CM | POA: Insufficient documentation

## 2022-01-04 DIAGNOSIS — F1090 Alcohol use, unspecified, uncomplicated: Secondary | ICD-10-CM

## 2022-01-04 DIAGNOSIS — O99332 Smoking (tobacco) complicating pregnancy, second trimester: Secondary | ICD-10-CM | POA: Insufficient documentation

## 2022-01-04 DIAGNOSIS — O99310 Alcohol use complicating pregnancy, unspecified trimester: Secondary | ICD-10-CM | POA: Insufficient documentation

## 2022-01-04 DIAGNOSIS — Z3A29 29 weeks gestation of pregnancy: Secondary | ICD-10-CM

## 2022-01-04 DIAGNOSIS — O99333 Smoking (tobacco) complicating pregnancy, third trimester: Secondary | ICD-10-CM

## 2022-01-14 NOTE — Addendum Note (Signed)
Addended by: Heywood Bene on: 01/14/2022 02:13 PM   Modules accepted: Orders

## 2022-01-18 ENCOUNTER — Ambulatory Visit: Payer: Self-pay | Admitting: Advanced Practice Midwife

## 2022-01-18 VITALS — BP 100/57 | HR 100 | Temp 97.8°F | Wt 151.4 lb

## 2022-01-18 DIAGNOSIS — Z720 Tobacco use: Secondary | ICD-10-CM

## 2022-01-18 DIAGNOSIS — O0991 Supervision of high risk pregnancy, unspecified, first trimester: Secondary | ICD-10-CM

## 2022-01-18 DIAGNOSIS — F1010 Alcohol abuse, uncomplicated: Secondary | ICD-10-CM

## 2022-01-18 DIAGNOSIS — O0993 Supervision of high risk pregnancy, unspecified, third trimester: Secondary | ICD-10-CM

## 2022-01-18 MED ORDER — PRENATAL MULTIVITAMIN CH: 1.0000 | ORAL_TABLET | Freq: Every day | ORAL | 0 refills | Status: AC

## 2022-01-18 NOTE — Addendum Note (Signed)
Addended by: Floy Sabina on: 01/18/2022 12:08 PM   Modules accepted: Orders
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
## 2022-01-18 NOTE — Progress Notes (Signed)
Kick count cars given and explained during visit.   Patient states she is low on PNV. PNV dispensed to patient today. She is also taking ASA 81mg  daily.   , RN

## 2022-02-01 ENCOUNTER — Ambulatory Visit: Payer: Self-pay

## 2022-02-01 ENCOUNTER — Telehealth: Payer: Self-pay

## 2022-02-01 NOTE — Telephone Encounter (Signed)
DNKA in [facility] as scheduled today. Call to client with [facility] [employer]pre[employer] ID # [id number]. Per client, she thought appt was tomorrow. Appt rescheduled for 02/04/2022 with arrival time of 1500. Jossie Ng, RN

## 2022-02-04 ENCOUNTER — Ambulatory Visit: Payer: Self-pay | Admitting: Nurse Practitioner

## 2022-02-04 VITALS — BP 124/78 | HR 99 | Temp 97.8°F | Wt 154.4 lb

## 2022-02-04 DIAGNOSIS — Z720 Tobacco use: Secondary | ICD-10-CM

## 2022-02-04 DIAGNOSIS — O99213 Obesity complicating pregnancy, third trimester: Secondary | ICD-10-CM

## 2022-02-04 DIAGNOSIS — O0991 Supervision of high risk pregnancy, unspecified, first trimester: Secondary | ICD-10-CM

## 2022-02-04 DIAGNOSIS — O0993 Supervision of high risk pregnancy, unspecified, third trimester: Secondary | ICD-10-CM

## 2022-02-04 DIAGNOSIS — F1010 Alcohol abuse, uncomplicated: Secondary | ICD-10-CM

## 2022-02-04 NOTE — Progress Notes (Signed)
Patient reminded of upcoming [place] visit 06/27. [facility] Spanish Interpretor present Roddie Mc). Delynn Flavin RN

## 2022-02-06 ENCOUNTER — Encounter: Payer: Self-pay | Admitting: Nurse Practitioner
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
## 2022-02-15 ENCOUNTER — Ambulatory Visit: Payer: Self-pay | Attending: Obstetrics

## 2022-02-15 ENCOUNTER — Ambulatory Visit: Payer: Self-pay | Admitting: *Deleted

## 2022-02-15 VITALS — BP 110/68 | HR 84

## 2022-02-15 DIAGNOSIS — O99313 Alcohol use complicating pregnancy, third trimester: Secondary | ICD-10-CM

## 2022-02-15 DIAGNOSIS — O99333 Smoking (tobacco) complicating pregnancy, third trimester: Secondary | ICD-10-CM

## 2022-02-15 DIAGNOSIS — E669 Obesity, unspecified: Secondary | ICD-10-CM

## 2022-02-15 DIAGNOSIS — F17210 Nicotine dependence, cigarettes, uncomplicated: Secondary | ICD-10-CM

## 2022-02-15 DIAGNOSIS — O99311 Alcohol use complicating pregnancy, first trimester: Secondary | ICD-10-CM

## 2022-02-15 DIAGNOSIS — Z3A35 35 weeks gestation of pregnancy: Secondary | ICD-10-CM

## 2022-02-15 DIAGNOSIS — Z6830 Body mass index (BMI) 30.0-30.9, adult: Secondary | ICD-10-CM

## 2022-02-15 DIAGNOSIS — O99213 Obesity complicating pregnancy, third trimester: Secondary | ICD-10-CM

## 2022-02-15 DIAGNOSIS — F1011 Alcohol abuse, in remission: Secondary | ICD-10-CM

## 2022-02-17 ENCOUNTER — Encounter: Payer: Self-pay | Admitting: Obstetrics and Gynecology

## 2022-02-17 ENCOUNTER — Other Ambulatory Visit: Payer: Self-pay

## 2022-02-17 ENCOUNTER — Observation Stay
Admission: EM | Admit: 2022-02-17 | Discharge: 2022-02-17 | Disposition: A | Discharge status: Home / Self Care | Payer: Self-pay | Attending: Obstetrics | Admitting: Obstetrics

## 2022-02-17 DIAGNOSIS — O23593 Infection of other part of genital tract in pregnancy, third trimester: Secondary | ICD-10-CM | POA: Insufficient documentation

## 2022-02-17 DIAGNOSIS — O26893 Other specified pregnancy related conditions, third trimester: Principal | ICD-10-CM

## 2022-02-17 DIAGNOSIS — Z7982 Long term (current) use of aspirin: Secondary | ICD-10-CM | POA: Insufficient documentation

## 2022-02-17 DIAGNOSIS — Z87891 Personal history of nicotine dependence: Secondary | ICD-10-CM | POA: Insufficient documentation

## 2022-02-17 DIAGNOSIS — Z3A35 35 weeks gestation of pregnancy: Secondary | ICD-10-CM

## 2022-02-17 DIAGNOSIS — R109 Unspecified abdominal pain: Secondary | ICD-10-CM

## 2022-02-17 LAB — URINALYSIS, ROUTINE W REFLEX MICROSCOPIC
Bilirubin Urine: NEGATIVE
Glucose, UA: NEGATIVE mg/dL
Hgb urine dipstick: NEGATIVE
Ketones, ur: 20 mg/dL — AB
Nitrite: NEGATIVE
Protein, ur: 30 mg/dL — AB
Specific Gravity, Urine: 1.016 (ref 1.005–1.030)
pH: 7 (ref 5.0–8.0)

## 2022-02-17 LAB — GROUP B STREP BY PCR: Group B strep by PCR: POSITIVE — AB

## 2022-02-17 LAB — CHLAMYDIA/NGC RT PCR (ARMC ONLY)
Chlamydia Tr: NOT DETECTED
N gonorrhoeae: NOT DETECTED

## 2022-02-17 LAB — RUPTURE OF MEMBRANE (ROM)PLUS: Rom Plus: NEGATIVE

## 2022-02-17 MED ORDER — ONDANSETRON HCL 4 MG/2ML IJ SOLN: 4.0000 mg | Freq: Four times a day (QID) | INTRAMUSCULAR | Status: DC | PRN

## 2022-02-17 MED ORDER — HYDROXYZINE HCL 50 MG PO TABS
50.0000 mg | ORAL_TABLET | Freq: Once | ORAL | Status: AC
  Administered 2022-02-17: 50 mg via ORAL
  Filled 2022-02-17: qty 1

## 2022-02-17 MED ORDER — LACTATED RINGERS IV SOLN
500.0000 mL | Freq: Once | INTRAVENOUS | Status: AC
  Administered 2022-02-17: 1000 mL via INTRAVENOUS

## 2022-02-17 MED ORDER — ACETAMINOPHEN 500 MG PO TABS
1000.0000 mg | ORAL_TABLET | Freq: Once | ORAL | Status: AC
  Administered 2022-02-17: 1000 mg via ORAL
  Filled 2022-02-17: qty 2

## 2022-02-17 NOTE — Discharge Summary (Signed)
   Please see Final Progress Note.  Mirna Mires, CNM  02/17/2022 9:15 PM

## 2022-02-17 NOTE — OB Triage Note (Signed)
Pt G3P1 [redacted]w[redacted]d presents with crmpaing in her lower and back since this morning. Pt reports 8/10 pain. Reports watery/mucous white discharge for the last 3 days. Pt reports she is wearing a pad and it leaks when she stands up. +FM. Denies bleeding. Denies urinary symptoms. VSS. Pt reports vomiting x 5 that started 2 hours ago.
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
## 2022-02-18 ENCOUNTER — Ambulatory Visit: Payer: Self-pay | Admitting: Advanced Practice Midwife

## 2022-02-18 DIAGNOSIS — O0991 Supervision of high risk pregnancy, unspecified, first trimester: Secondary | ICD-10-CM

## 2022-02-18 DIAGNOSIS — F1010 Alcohol abuse, uncomplicated: Secondary | ICD-10-CM

## 2022-02-18 DIAGNOSIS — Z720 Tobacco use: Secondary | ICD-10-CM

## 2022-02-18 NOTE — Progress Notes (Addendum)
Patient denies contractions today at visit. Return to clinic in 1 week.   Delynn Flavin RN
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
## 2022-02-19 LAB — URINE CULTURE

## 2022-02-24 ENCOUNTER — Ambulatory Visit: Payer: Self-pay | Admitting: Physician Assistant

## 2022-02-24 ENCOUNTER — Encounter: Payer: Self-pay | Admitting: Physician Assistant

## 2022-02-24 VITALS — BP 105/66 | HR 81 | Temp 97.4°F | Wt 154.6 lb

## 2022-02-24 DIAGNOSIS — O99820 Streptococcus B carrier state complicating pregnancy: Secondary | ICD-10-CM

## 2022-02-24 DIAGNOSIS — O0991 Supervision of high risk pregnancy, unspecified, first trimester: Secondary | ICD-10-CM

## 2022-02-24 DIAGNOSIS — O0993 Supervision of high risk pregnancy, unspecified, third trimester: Secondary | ICD-10-CM

## 2022-02-24 DIAGNOSIS — O99213 Obesity complicating pregnancy, third trimester: Secondary | ICD-10-CM

## 2022-02-24 MED ORDER — CLOTRIMAZOLE 1 % VA CREA: 1.0000 | TOPICAL_CREAM | Freq: Every day | VAGINAL | 0 refills | Status: AC

## 2022-02-24 NOTE — Addendum Note (Signed)
Addended by: Delynn Flavin A on: 02/24/2022 06:14 PM   Modules accepted: Orders
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
## 2022-02-24 NOTE — Progress Notes (Addendum)
Patient states at 10 a.m. felt leaking of fluid from vaginal describes as white without odor, white and has not felt leaking since then. Denies pain or cramping. Taking Aspirin. 36 week packet given that includes car seat information and safe sleep for baby. [facility] Spanish Interpretor Roddie Mc) utilized at visit.  Provider aware of Wet Prep results. Dispensed Clotrimazole Vaginal cream to treat yeast infection (see wet prep results). Provided counseling regarding the medication. We discussed the medication, the side effects and when to call clinic. Patient given the opportunity to ask questions. Questions answered.Delynn Flavin RN

## 2022-02-25 LAB — WET PREP FOR TRICH, YEAST, CLUE: Trichomonas Exam: NEGATIVE

## 2022-02-26 LAB — URINE CULTURE

## 2022-03-03 ENCOUNTER — Ambulatory Visit: Payer: Self-pay | Admitting: Nurse Practitioner

## 2022-03-03 ENCOUNTER — Encounter: Payer: Self-pay | Admitting: Nurse Practitioner

## 2022-03-03 ENCOUNTER — Ambulatory Visit: Payer: Self-pay

## 2022-03-03 VITALS — BP 90/58 | HR 94 | Temp 98.6°F | Wt 156.8 lb

## 2022-03-03 DIAGNOSIS — O0993 Supervision of high risk pregnancy, unspecified, third trimester: Secondary | ICD-10-CM

## 2022-03-03 DIAGNOSIS — O0991 Supervision of high risk pregnancy, unspecified, first trimester: Secondary | ICD-10-CM

## 2022-03-03 DIAGNOSIS — F1010 Alcohol abuse, uncomplicated: Secondary | ICD-10-CM

## 2022-03-03 DIAGNOSIS — O99820 Streptococcus B carrier state complicating pregnancy: Secondary | ICD-10-CM

## 2022-03-03 DIAGNOSIS — Z720 Tobacco use: Secondary | ICD-10-CM

## 2022-03-03 DIAGNOSIS — O99213 Obesity complicating pregnancy, third trimester: Secondary | ICD-10-CM

## 2022-03-03 NOTE — Progress Notes (Signed)
Per client, has 2 more doses remaining of Clotrimazole vaginal cream. Last took Aspirin one week ago. Jossie Ng, RN
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
## 2022-03-04 ENCOUNTER — Encounter: Payer: Self-pay | Admitting: Obstetrics and Gynecology

## 2022-03-04 ENCOUNTER — Other Ambulatory Visit: Payer: Self-pay

## 2022-03-04 ENCOUNTER — Inpatient Hospital Stay
Admission: EM | Admit: 2022-03-04 | Discharge: 2022-03-04 | Disposition: A | Discharge status: Home / Self Care | Payer: Self-pay | Attending: Obstetrics and Gynecology | Admitting: Obstetrics and Gynecology

## 2022-03-04 DIAGNOSIS — Z3A37 37 weeks gestation of pregnancy: Secondary | ICD-10-CM

## 2022-03-04 DIAGNOSIS — O99891 Other specified diseases and conditions complicating pregnancy: Secondary | ICD-10-CM

## 2022-03-04 DIAGNOSIS — Z7982 Long term (current) use of aspirin: Secondary | ICD-10-CM | POA: Insufficient documentation

## 2022-03-04 DIAGNOSIS — Z87891 Personal history of nicotine dependence: Secondary | ICD-10-CM | POA: Insufficient documentation

## 2022-03-04 DIAGNOSIS — O26893 Other specified pregnancy related conditions, third trimester: Secondary | ICD-10-CM | POA: Insufficient documentation

## 2022-03-04 DIAGNOSIS — O99820 Streptococcus B carrier state complicating pregnancy: Secondary | ICD-10-CM

## 2022-03-04 DIAGNOSIS — N898 Other specified noninflammatory disorders of vagina: Secondary | ICD-10-CM

## 2022-03-04 LAB — RUPTURE OF MEMBRANE (ROM)PLUS: Rom Plus: NEGATIVE

## 2022-03-04 NOTE — OB Triage Note (Signed)
Pt reports gush of fluid @ approx 1330 today. Reports white/clear fluid x 1. Denies vaginal bleeding. Reports + fetal movement. Latoya Potter

## 2022-03-04 NOTE — OB Triage Note (Signed)
Discharge home. Labor precautions given. Follow up appointment already scheduled. Left floor ambulatory with family. Elaina Hoops
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
## 2022-03-04 NOTE — Discharge Summary (Signed)
See Final Progress note.  Mirna Mires, CNM  03/04/2022 4:21 PM

## 2022-03-11 ENCOUNTER — Ambulatory Visit: Payer: Self-pay | Admitting: Nurse Practitioner

## 2022-03-11 VITALS — BP 111/77 | HR 82 | Temp 98.0°F | Wt 160.2 lb

## 2022-03-11 DIAGNOSIS — O99891 Other specified diseases and conditions complicating pregnancy: Secondary | ICD-10-CM

## 2022-03-11 DIAGNOSIS — F1010 Alcohol abuse, uncomplicated: Secondary | ICD-10-CM

## 2022-03-11 DIAGNOSIS — Z720 Tobacco use: Secondary | ICD-10-CM

## 2022-03-11 DIAGNOSIS — M549 Dorsalgia, unspecified: Secondary | ICD-10-CM

## 2022-03-11 DIAGNOSIS — O0993 Supervision of high risk pregnancy, unspecified, third trimester: Secondary | ICD-10-CM

## 2022-03-11 DIAGNOSIS — O26893 Other specified pregnancy related conditions, third trimester: Secondary | ICD-10-CM

## 2022-03-11 DIAGNOSIS — O0991 Supervision of high risk pregnancy, unspecified, first trimester: Secondary | ICD-10-CM

## 2022-03-11 DIAGNOSIS — O99213 Obesity complicating pregnancy, third trimester: Secondary | ICD-10-CM

## 2022-03-11 DIAGNOSIS — O479 False labor, unspecified: Secondary | ICD-10-CM

## 2022-03-11 DIAGNOSIS — R109 Unspecified abdominal pain: Secondary | ICD-10-CM

## 2022-03-11 NOTE — Progress Notes (Signed)
[employer] # [id number]. Recent ER visit 03/04/2022. States mucous vaginal  discharge. No other symptoms. Delynn Flavin RN

## 2022-03-11 NOTE — Progress Notes (Signed)
[facility] IOL referral faxed with snapshot, confirmation received.Burt Knack, RN

## 2022-03-12 ENCOUNTER — Encounter: Payer: Self-pay | Admitting: Obstetrics

## 2022-03-12 ENCOUNTER — Other Ambulatory Visit: Payer: Self-pay

## 2022-03-12 ENCOUNTER — Observation Stay
Admission: EM | Admit: 2022-03-12 | Discharge: 2022-03-12 | Disposition: A | Discharge status: Home / Self Care | Payer: Self-pay | Attending: Licensed Practical Nurse | Admitting: Licensed Practical Nurse

## 2022-03-12 DIAGNOSIS — O99820 Streptococcus B carrier state complicating pregnancy: Secondary | ICD-10-CM

## 2022-03-12 DIAGNOSIS — Z3A38 38 weeks gestation of pregnancy: Secondary | ICD-10-CM | POA: Insufficient documentation

## 2022-03-12 DIAGNOSIS — O26893 Other specified pregnancy related conditions, third trimester: Secondary | ICD-10-CM

## 2022-03-12 DIAGNOSIS — O479 False labor, unspecified: Secondary | ICD-10-CM | POA: Diagnosis present

## 2022-03-12 DIAGNOSIS — O471 False labor at or after 37 completed weeks of gestation: Principal | ICD-10-CM | POA: Insufficient documentation

## 2022-03-12 DIAGNOSIS — R109 Unspecified abdominal pain: Secondary | ICD-10-CM

## 2022-03-12 DIAGNOSIS — Z87891 Personal history of nicotine dependence: Secondary | ICD-10-CM | POA: Insufficient documentation

## 2022-03-12 DIAGNOSIS — Z7982 Long term (current) use of aspirin: Secondary | ICD-10-CM | POA: Insufficient documentation

## 2022-03-12 NOTE — OB Triage Note (Signed)
Latoya Potter 26 y.o. presents to Labor & Delivery triage via wheelchair steered by ED staff reporting bloody show and contractions. She is a G3P1011 at [redacted]w[redacted]d . She denies signs and symptoms consistent with rupture of membranes or active vaginal bleeding. She states positive fetal movement. External FM and TOCO applied to non-tender abdomen. Initial FHR 140. Vital signs obtained and within normal limits. Patient oriented to care environment including call bell and bed control use. Carie Caddy, CNM notified of patient's arrival. Plan to recheck cervix in 2 hours.

## 2022-03-12 NOTE — Progress Notes (Signed)
Patient discharged home per order.  She is stable and ambulatory. An After Visit Summary was printed and given to the patient. Discharge education completed with patient and support person including follow up instructions, appointments, and medication list. She received labor and bleeding precautions. Patient able to verbalize understanding. All questions fully answered upon discharge. Patient instructed to return to ED, call 911, or call provider for any changes in condition. Patient discharged home via personal vehicle with support person and all belongings.    
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
## 2022-03-13 ENCOUNTER — Encounter: Payer: Self-pay | Admitting: Nurse Practitioner
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
## 2022-03-15 ENCOUNTER — Telehealth: Payer: Self-pay

## 2022-03-15 NOTE — Telephone Encounter (Signed)
Telephone call to patient today regarding information about her IOL.  Per [facility], patient is scheduled for her IOL on Sunday 03/27/22 at 0800 through the ER.  Per female that answered the phone, she just got into the shower.  We will try back in about 30 minutes.  Language Line used today.  Revonda Standard / [phone].  Hart Carwin, RN

## 2022-03-15 NOTE — Telephone Encounter (Signed)
-----   Message from Tresea Mall, CNM sent at 03/15/2022  3:36 PM EDT ----- Regarding: IOL scheduled IOL scheduled for 03/27/22 at 0800

## 2022-03-15 NOTE — Telephone Encounter (Signed)
Language Line used today. Alesia Banda / [phone].  Hart Carwin, RN

## 2022-03-15 NOTE — Telephone Encounter (Signed)
Return call back to patient regarding her IOL date/time.  Patient reports having delivered her baby girl on 03-12-2022.  She reports going to [facility]/ with pain, but was sent home.  She reports delivering at [facility]. Hart Carwin, RN

## 2022-03-18 ENCOUNTER — Ambulatory Visit: Payer: Self-pay

## 2022-03-25 ENCOUNTER — Ambulatory Visit: Payer: Self-pay

## 2022-04-26 ENCOUNTER — Encounter: Payer: Self-pay | Admitting: Family Medicine

## 2022-04-26 ENCOUNTER — Ambulatory Visit: Payer: Self-pay | Admitting: Family Medicine

## 2022-04-26 DIAGNOSIS — Z975 Presence of (intrauterine) contraceptive device: Secondary | ICD-10-CM

## 2022-04-26 DIAGNOSIS — Z30430 Encounter for insertion of intrauterine contraceptive device: Secondary | ICD-10-CM

## 2022-04-26 MED ORDER — PARAGARD INTRAUTERINE COPPER IU IUD
1.0000 | INTRAUTERINE_SYSTEM | Freq: Once | INTRAUTERINE | Status: AC
  Administered 2022-04-26: 1 via INTRAUTERINE

## 2022-04-26 MED ORDER — PRENATAL VITAMINS 28-0.8 MG PO TABS: 1.0000 | ORAL_TABLET | Freq: Every day | ORAL | 0 refills | Status: AC

## 2022-04-26 NOTE — Progress Notes (Signed)
Postpartum visit materials given, consents signed for Paragaurd IUD, post placement care instructions given to patient.   The patient was dispensed Prenatal Vitamins  today. I provided counseling today regarding the medication. We discussed the medication, the side effects and when to call clinic. Patient given the opportunity to ask questions. Questions answered. Shiela Mayer RN

## 2022-04-26 NOTE — Progress Notes (Signed)
[facility] Department  Postpartum Exam  Latoya Broxterman Felton Potter is a 26 y.o. [phone] female who presents for a postpartum visit. She is 7 weeks postpartum following a normal spontaneous vaginal delivery.  I have fully reviewed the prenatal and intrapartum course. The delivery was at [redacted]w[redacted]d gestational weeks.  Delivered at [facility]- presented in labor at 5cm  Anesthesia: epidural. Postpartum course has been uncomplicated. Baby is doing well. Baby is feeding by  breast and formula . Bleeding no bleeding. Bowel function is normal. Bladder function is normal. Patient is not sexually active. Contraception method is IUD. Postpartum depression screening: negative.  Reports HA and back pain. No red flag sx. Likely related to nursing and dehydration.   The pregnancy intention screening data noted above was reviewed. Potential methods of contraception were discussed. The patient elected to proceed with IUD or IUS.    Health Maintenance Due  Topic Date Due   COVID-19 Vaccine (1) Never done   HPV VACCINES (1 - 2-dose series) Never done   INFLUENZA VACCINE  03/22/2022    The following portions of the patient's history were reviewed and updated as appropriate: allergies, current medications, past family history, past medical history, past social history, past surgical history, and problem list.  Review of Systems Pertinent items are noted in HPI.  Objective:  BP 109/71   Pulse 89   Temp (!) 97.5 F (36.4 C)   Wt 134 lb 12.8 oz (61.1 kg)   LMP 03/13/2022 (Approximate)   Breastfeeding Yes   BMI 28.17 kg/m    General:  alert, cooperative, and appears stated age   Breasts:  normal  Lungs: clear to auscultation bilaterally  Heart:  regular rate and rhythm, S1, S2 normal, no murmur, click, rub or gallop  Abdomen: soft, non-tender; bowel sounds normal; no masses,  no organomegaly   Wound NA  GU exam:  normal        Patient presented to [facility] for IUD insertion. Her  GC/CT screening was found to be up to date and using WHO criteria we can be reasonably certain she is not pregnant. See Flowsheet for IUD check list  IUD Insertion Procedure Note Patient identified, informed consent performed, consent signed.   Discussed risks of irregular bleeding, cramping, infection, malpositioning or misplacement of the IUD outside the uterus which may require further procedure such as laparoscopy. Time out was performed.    Speculum placed in the vagina.  Cervix visualized.  Cleaned with Betadine x 2.  Grasped anteriorly with a single tooth tenaculum.  Uterus sounded to 9 cm.  IUD placed per manufacturer's recommendations.  Strings trimmed to 3 cm. Tenaculum was removed, good hemostasis noted after application of Monsels.  Patient tolerated procedure well.     Assessment:    1. Postpartum exam Patient was given post-procedure instructions- both agency handout and verbally by provider.  She was advised to have backup contraception for one week.  Patient was also asked to check IUD strings periodically or follow up in 4 weeks for IUD check.   Normal postpartum exam.   Plan:   Essential components of care per ACOG recommendations:  1.  Mood and well being: Patient with negative depression screening today. Reviewed local resources for support.  - Patient tobacco use? Yes. Patient desires to quit? No.   - hx of drug use? No.    2. Infant care and feeding:  -Patient currently breastmilk feeding? No.  -Social determinants of health (SDOH) reviewed in EPIC. No  concerns  3. Sexuality, contraception and birth spacing - Patient does not want a pregnancy in the next year.  Desired family size is 2 children.  - Reviewed reproductive life planning. Reviewed options based on patient desire and reproductive life plan. Patient is interested in IUD or IUS. This was provided to the patient today.   Risks, benefits, and typical effectiveness rates were reviewed.  Questions were  answered.  Written information was also given to the patient to review.    The patient will follow up in  4 weeks for surveillance.  The patient was told to call with any further questions, or with any concerns about this method of contraception.  Emphasized use of condoms 100% of the time for STI prevention. -  Discussed birth spacing of 18 months  4. Sleep and fatigue -Encouraged family/partner/community support of 4 hrs of uninterrupted sleep to help with mood and fatigue  5. Physical Recovery  - Discussed patients delivery and complications. She describes her labor as good. - Patient had a Vaginal, no problems at delivery. Patient had a 2nd degree laceration. Perineal healing reviewed. Patient expressed understanding - Patient has urinary incontinence? No. - Patient is safe to resume physical and sexual activity  6.  Health Maintenance - HM due items addressed Yes - Last pap smear 02/28/2020  Pap smear not done at today's visit.  -Breast Cancer screening indicated? No.   7. Chronic Disease/Pregnancy Condition follow up: None  - PCP follow up  Federico Flake, MD Center for [facility], [facility]

## 2022-04-29 DIAGNOSIS — Z975 Presence of (intrauterine) contraceptive device: Secondary | ICD-10-CM | POA: Insufficient documentation

## 2022-04-29 HISTORY — DX: Presence of (intrauterine) contraceptive device: Z97.5

## 2022-05-19 ENCOUNTER — Ambulatory Visit: Payer: Self-pay

## 2022-05-30 ENCOUNTER — Encounter: Payer: Self-pay | Admitting: Advanced Practice Midwife

## 2022-05-30 ENCOUNTER — Ambulatory Visit (LOCAL_COMMUNITY_HEALTH_CENTER): Payer: Self-pay | Admitting: Advanced Practice Midwife

## 2022-05-30 VITALS — BP 111/76 | Ht <= 58 in | Wt 138.0 lb

## 2022-05-30 DIAGNOSIS — Z3009 Encounter for other general counseling and advice on contraception: Secondary | ICD-10-CM

## 2022-05-30 DIAGNOSIS — Z3049 Encounter for surveillance of other contraceptives: Secondary | ICD-10-CM

## 2022-05-30 DIAGNOSIS — Z975 Presence of (intrauterine) contraceptive device: Secondary | ICD-10-CM

## 2022-05-30 NOTE — Progress Notes (Signed)
Patient here for IUD check. Paraguard inserted at her PP appointment on 04/26/22. Last Pap was 02/28/2020, NILM.Jenetta Downer, RN

## 2022-05-30 NOTE — Progress Notes (Signed)
[facility] [place] Number: [phone]  Contraception/Family Planning VISIT ENCOUNTER NOTE  Subjective:   Latoya Potter is a 26 y.o. SHF [id number] (6, 2 mo) female here for reproductive life counseling. The patient is currently using IUD or IUS to prevent pregnancy.  Here for IUD string check and c/o internal vaginal itching x 6 days. Also c/o sneezing, sore throat, runny nose since yesterday.  The patient does not want a pregnancy in the next year.    Client states they are looking for the following:  High efficacy at preventing pregnancy  Denies abnormal vaginal bleeding, discharge, pelvic pain, problems with intercourse or other gynecologic concerns.    Gynecologic History No LMP recorded.  Health Maintenance Due  Topic Date Due   COVID-19 Vaccine (1) Never done   HPV VACCINES (1 - 2-dose series) Never done   INFLUENZA VACCINE  03/22/2022     The following portions of the patient's history were reviewed and updated as appropriate: allergies, current medications, past family history, past medical history, past social history, past surgical history and problem list.  Review of Systems Pertinent items are noted in HPI.   Objective:  BP 111/76   Ht 4\' 9"  (1.448 m)   Wt 138 lb (62.6 kg)   Breastfeeding No   BMI 29.86 kg/m  Gen: well appearing, NAD HEENT: no scleral icterus CV: RR Lung: Normal WOB Ext: warm well perfused  PELVIC: Normal appearing external genitalia; normal appearing vaginal mucosa and cervix.  Light pink leukorrhea, Pap smear not obtained. IUD strings visualized   Assessment and Plan:   Need for emergency contraceptive care was assessed today.  Last unprotected sex was:  not meeting criteria  Patient reported not meeting criteria.  Reviewed options and patient desired Cu-IUD   Contraception counseling: Reviewed methods in a patient centered fashion and used  shared decision making with the patient. Utilized [employer]stream patient education tools as appropriate. The patient stated there goals and desires from a method are: High efficacy at preventing pregnancy  We reviewed the following methods in detail based on patient preferences available included: IUD or IUS  Patient expressed they would like IUD or IUS  This was inserted on 04/26/22 at [facility] exam.  if not why not clearly documented Risks, benefits, and typical effectiveness rates were reviewed.  Questions were answered.  Written information was also given to the patient to review.       will follow up in  prn     for surveillance.   was told to call with any further questions, or with any concerns about this method of contraception or cycle control.  Emphasized use of condoms 100% of the time for STI prevention.   1. IUD (intrauterine device) in place Paraguard 04/26/22 Visualized IUD strings in place - [place], YEAST, CLUE  2. Family planning C/o internal vaginal itching x 6 days Wet mount done Encouraged to do covid test today  3. Encounter for surveillance of other contraceptive     Please refer to After Visit Summary for other counseling recommendations.   No follow-ups on file.  Herbie Saxon, [place]

## 2022-05-31 LAB — WET PREP FOR TRICH, YEAST, CLUE
Trichomonas Exam: NEGATIVE
Yeast Exam: NEGATIVE

## 2022-08-07 IMAGING — US US MFM OB FOLLOW-UP
2 series · 13 of 28 positions shown · non-contrast
Comparison: none

[Series 1: us mfm ob follow-up · 10 of 22 slices shown (1 of 2)]
[im 2/22]
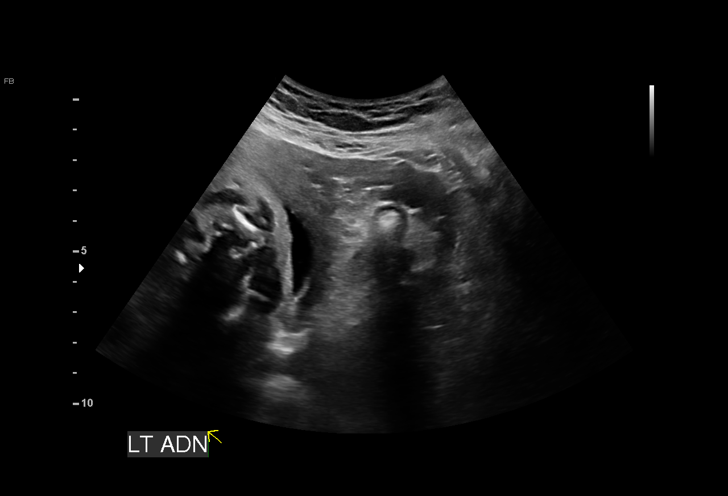
[im 4/22]
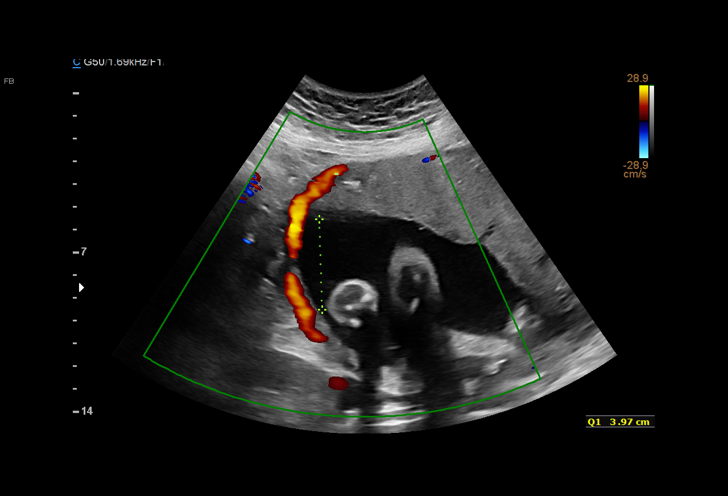
[im 6/22]
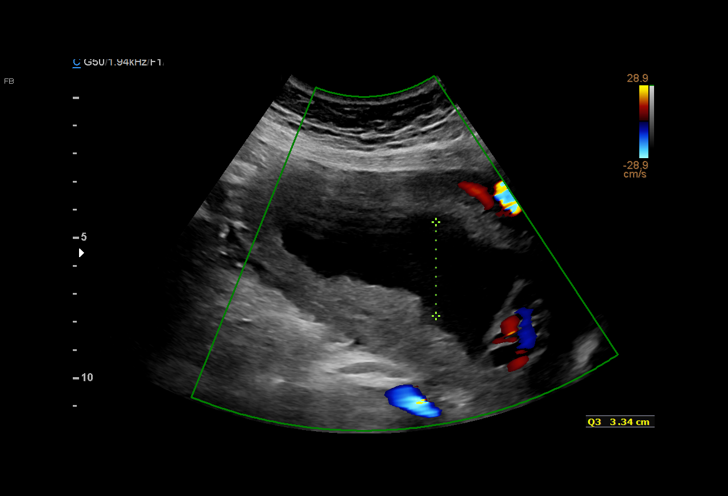
[im 8/22]
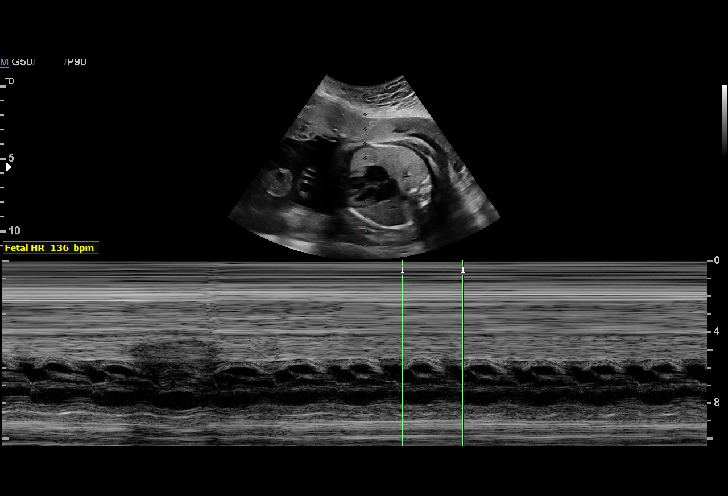
[im 10/22]
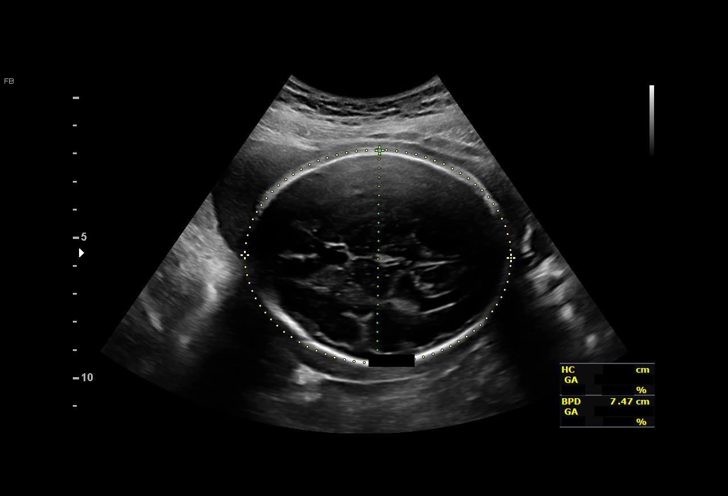
[im 12/22]
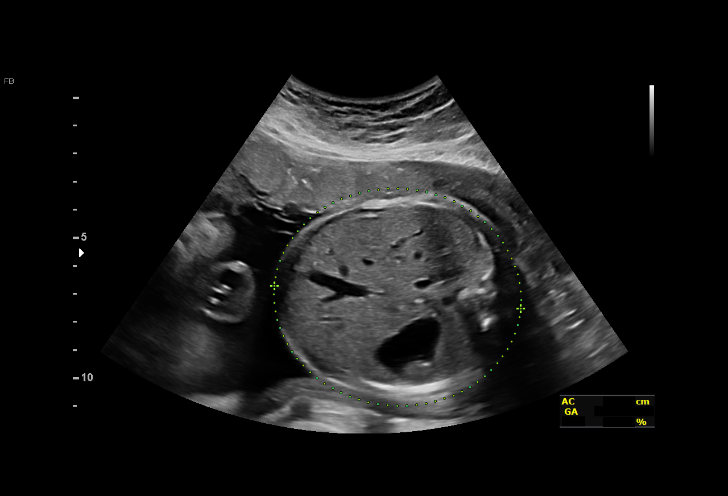
[im 15/22]
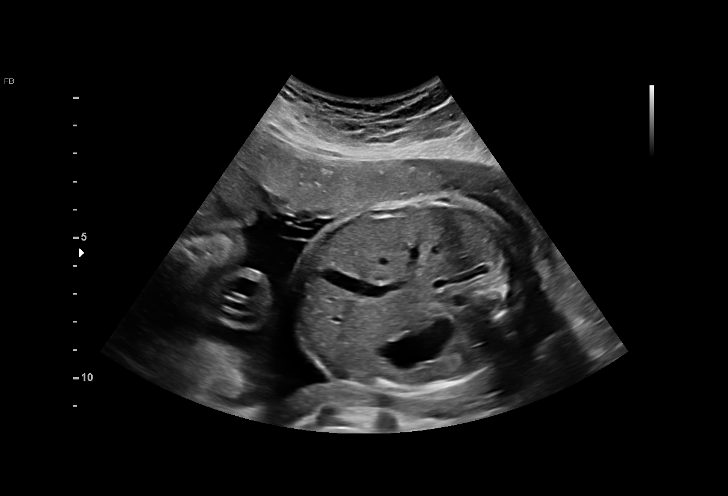
[im 17/22]
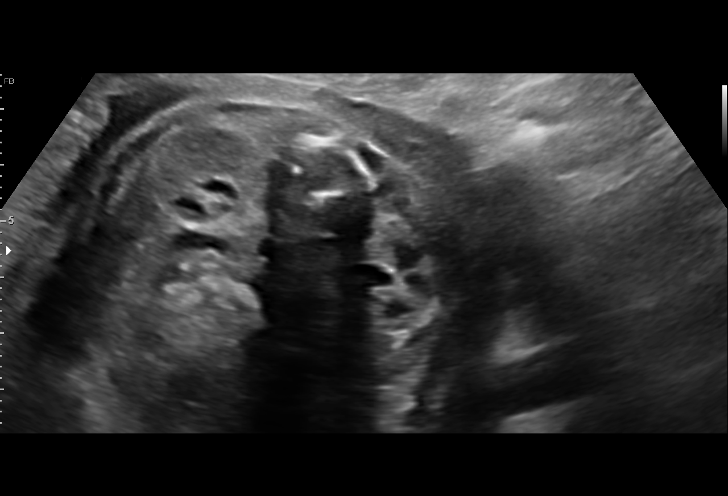
[im 19/22]
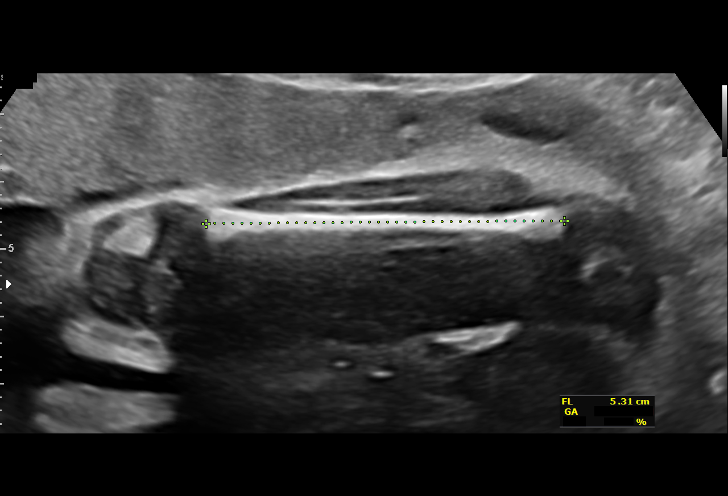
[im 21/22]
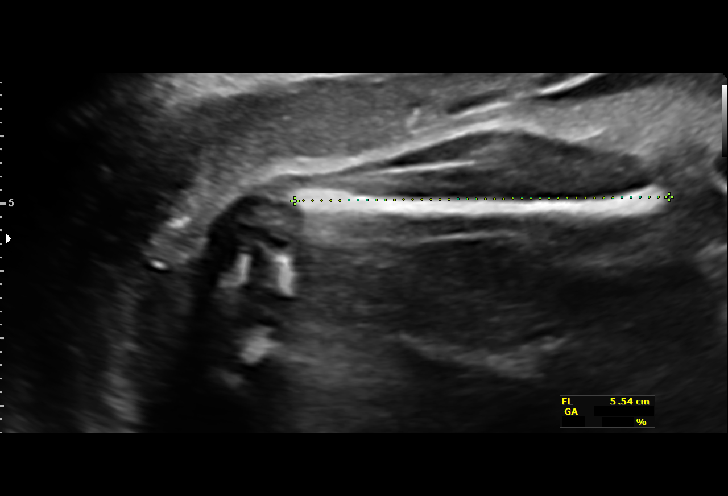

[Series 3: us mfm ob follow-up · 3 of 6 slices shown (2 of 2)]
[im 1/6]
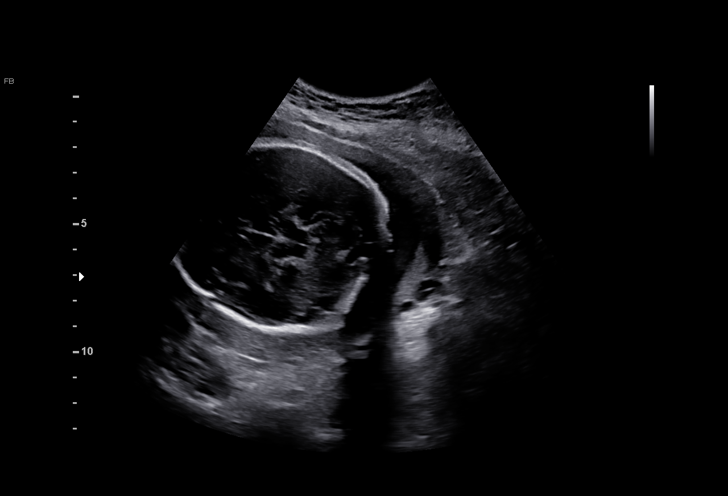
[im 3/6]
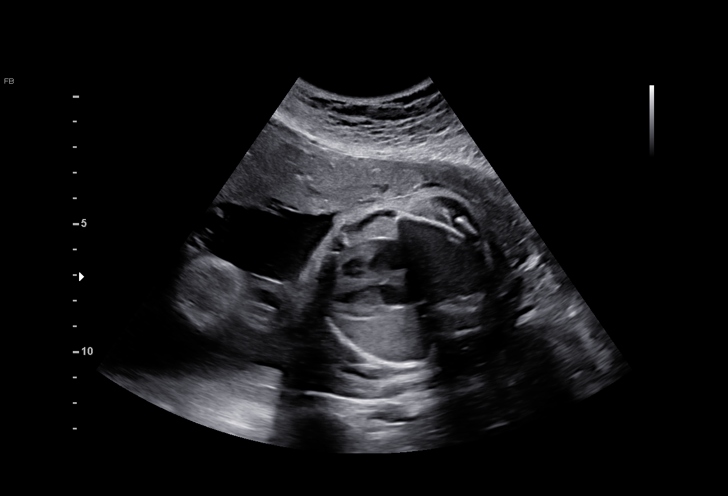
[im 5/6]
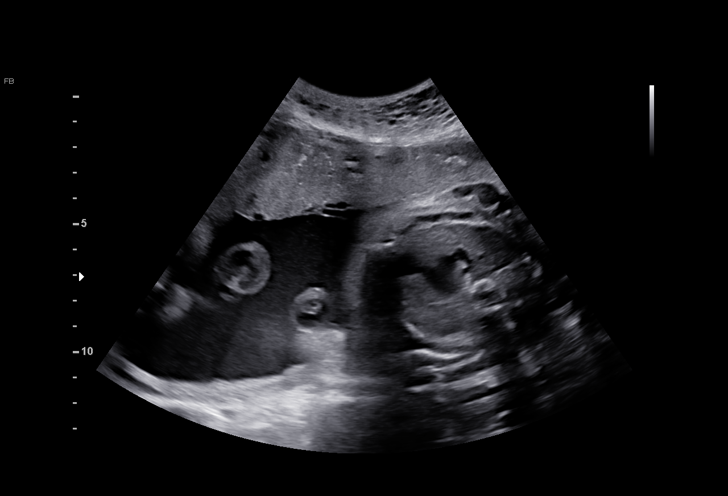

[13 of 28 positions shown; findings below may reference images not displayed]

PANKAZ LEION

                   NOMASIBULELE CNM

Indications

 Alcohol use complicating pregnancy, third
 trimester
 Tobacco use complicating pregnancy, third
 trimester
 Obesity complicating pregnancy (BMI 30)
 Neg AFP, declined NIPS
 29 weeks gestation of pregnancy
Fetal Evaluation

 Num Of Fetuses:         1
 Fetal Heart Rate(bpm):  136
 Cardiac Activity:       Observed
 Presentation:           Cephalic
 Placenta:               Anterior
 P. Cord Insertion:      Previously Visualized

 Amniotic Fluid
 AFI FV:      Within normal limits

 AFI Sum(cm)     %Tile       Largest Pocket(cm)
 15.45           55

 RUQ(cm)       RLQ(cm)       LUQ(cm)        LLQ(cm)

Biometry

 BPD:        75  mm     G. Age:  30w 1d         64  %    CI:        78.89   %    70 - 86
                                                         FL/HC:      20.3   %    19.6 -
 HC:       267   mm     G. Age:  29w 1d         13  %    HC/AC:      1.04        0.99 -
 AC:      257.2  mm     G. Age:  29w 6d         63  %    FL/BPD:     72.1   %    71 - 87
 FL:       54.1  mm     G. Age:  28w 4d         19  %    FL/AC:      21.0   %    20 - 24

 Est. FW:    9522  gm      3 lb 1 oz     41  %
OB History

 Gravidity:    3         Term:   1        Prem:   0        SAB:   1
 TOP:          0       Ectopic:  0        Living: 1
Gestational Age

 LMP:           33w 6d        Date:  05/12/21                 EDD:   02/16/22
 U/S Today:     29w 3d                                        EDD:   03/19/22
 Best:          29w 2d     Det. By:  U/S C R L  (08/04/21)    EDD:   03/20/22
Anatomy

 Cranium:               Appears normal         Aortic Arch:            Previously seen
 Cavum:                 Appears normal         Ductal Arch:            Previously seen
 Ventricles:            Previously seen        Diaphragm:              Appears normal
 Choroid Plexus:        Previously seen        Stomach:                Appears normal, left
                                                                       sided
 Cerebellum:            Previously seen        Abdomen:                Appears normal
 Posterior Fossa:       Previously seen        Abdominal Wall:         Previously seen
 Nuchal Fold:           Not applicable (>20    Cord Vessels:           Previously seen
                        wks GA)
 Face:                  Orbits and profile     Kidneys:                Appear normal
                        previously seen
 Lips:                  Previously seen        Bladder:                Appears normal
 Thoracic:              Appears normal         Spine:                  Previously seen
 Heart:                 Previously seen        Upper Extremities:      Previously seen
 RVOT:                  Previously seen        Lower Extremities:      Previously seen
 LVOT:                  Previously seen

 Other:  Fetus appears to be female. VC, 3VV and 3VTV,  Rt Heels/feet and
         open hands/5th digits, nasal bone, lenses, maxilla, mandible and falx
         previously previously visualized.
Cervix Uterus Adnexa

 Adnexa
 No abnormality visualized.
Comments

 This patient was seen for a follow up growth scan due to
 maternal alcohol use in the first trimester. She denies any
 problems since her last exam.
 She was informed that the fetal growth and amniotic fluid
 level appears appropriate for her gestational age.
 Due to maternal alcohol exposure in the first trimester, a
 follow-up growth scan scheduled in 6 weeks.
 All conversations were held with the patient today with the
 help of a Spanish interpreter.

## 2023-06-27 ENCOUNTER — Ambulatory Visit (LOCAL_COMMUNITY_HEALTH_CENTER): Payer: Self-pay | Admitting: Family Medicine

## 2023-06-27 ENCOUNTER — Encounter: Payer: Self-pay | Admitting: Family Medicine

## 2023-06-27 VITALS — BP 127/87 | HR 98 | Ht <= 58 in | Wt 138.0 lb

## 2023-06-27 DIAGNOSIS — B379 Candidiasis, unspecified: Secondary | ICD-10-CM

## 2023-06-27 DIAGNOSIS — Z309 Encounter for contraceptive management, unspecified: Secondary | ICD-10-CM

## 2023-06-27 DIAGNOSIS — B3731 Acute candidiasis of vulva and vagina: Secondary | ICD-10-CM

## 2023-06-27 DIAGNOSIS — Z113 Encounter for screening for infections with a predominantly sexual mode of transmission: Secondary | ICD-10-CM

## 2023-06-27 DIAGNOSIS — Z01419 Encounter for gynecological examination (general) (routine) without abnormal findings: Secondary | ICD-10-CM

## 2023-06-27 DIAGNOSIS — Z975 Presence of (intrauterine) contraceptive device: Secondary | ICD-10-CM

## 2023-06-27 LAB — WET PREP FOR TRICH, YEAST, CLUE: Trichomonas Exam: NEGATIVE

## 2023-06-27 LAB — HM HIV SCREENING LAB: HM HIV Screening: NEGATIVE

## 2023-06-27 MED ORDER — CLOTRIMAZOLE 1 % VA CREA: 1.0000 | TOPICAL_CREAM | Freq: Every day | VAGINAL | Status: AC
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
## 2023-06-27 NOTE — Progress Notes (Signed)
Pt is here for family planning visit.  Family planning packet reviewed and given to pt.  Wet prep results reviewed, treatment required per standing orders. Condoms given. The patient was dispensed clotrimazole today. I provided counseling today regarding the medication. We discussed the medication, the side effects and when to call clinic. Patient given the opportunity to ask questions. Questions answered.    Gaspar Garbe, RN

## 2023-06-30 LAB — IGP, RFX APTIMA HPV ASCU: PAP Smear Comment: 0

## 2023-10-12 ENCOUNTER — Ambulatory Visit: Payer: Self-pay

## 2023-10-23 ENCOUNTER — Ambulatory Visit: Payer: Self-pay

## 2023-10-23 ENCOUNTER — Ambulatory Visit (LOCAL_COMMUNITY_HEALTH_CENTER): Payer: Self-pay | Admitting: Family Medicine

## 2023-10-23 ENCOUNTER — Encounter: Payer: Self-pay | Admitting: Family Medicine

## 2023-10-23 VITALS — BP 111/78 | HR 87 | Ht <= 58 in | Wt 145.0 lb

## 2023-10-23 DIAGNOSIS — Z30017 Encounter for initial prescription of implantable subdermal contraceptive: Secondary | ICD-10-CM

## 2023-10-23 DIAGNOSIS — Z3009 Encounter for other general counseling and advice on contraception: Secondary | ICD-10-CM

## 2023-10-23 MED ORDER — ETONOGESTREL 68 MG ~~LOC~~ IMPL
68.0000 mg | DRUG_IMPLANT | Freq: Once | SUBCUTANEOUS | Status: AC
  Administered 2023-10-23: 68 mg via SUBCUTANEOUS

## 2023-10-23 NOTE — Progress Notes (Signed)
   [facility] Problem Visit  [facility]  Subjective:  Latoya Potter is a 28 y.o. being seen today for   Chief Complaint  Patient presents with   Contraception    Depo or nexplanon    HPI  Patient present for Nexplanon insert.    Health Maintenance Due  Topic Date Due   HPV VACCINES (1 - 3-dose series) Never done   INFLUENZA VACCINE  03/23/2023   COVID-19 Vaccine (1 - 2024-25 season) Never done    ROS  The following portions of the patient's history were reviewed and updated as appropriate: allergies, current medications, past family history, past medical history, past social history, past surgical history and problem list. Problem list updated.   See flowsheet for other program required questions.  Objective:   Vitals:   10/23/23 1603  BP: 111/78  Pulse: 87  Weight: 145 lb (65.8 kg)  Height: 4\' 9"  (1.448 m)    Physical Exam deferred   Assessment and Plan:  Latoya Potter is a 28 y.o. female presenting to the [facility] Department for a Women's Health problem visit  1. Encounter for initial prescription of Nexplanon (Primary) Nexplanon Insertion Procedure Patient identified, informed consent performed, consent signed.   Patient does understand that irregular bleeding is a very common side effect of this medication. She was advised to have backup contraception after placement. Patient was determined to meet WHO criteria for not being pregnant. Appropriate time out taken.  The insertion site was identified 8-10 cm (3-4 inches) from the medial epicondyle of the humerus and 3-5 cm (1.25-2 inches) posterior to (below) the sulcus (groove) between the biceps and triceps muscles of the patient's left arm and marked.  Patient was prepped with alcohol swab and then injected with 3 ml of 1% lidocaine.  Arm was prepped with chlorhexidene, Nexplanon removed from packaging,  Device confirmed  in needle, then inserted full length of needle and withdrawn per handbook instructions. Nexplanon was able to palpated in the patient's arm; patient palpated the insert herself. There was minimal blood loss.  Patient insertion site covered with guaze and a pressure bandage to reduce any bruising.  The patient tolerated the procedure well and was given post procedure instructions.   Nexplanon:   Counseled patient to take OTC analgesic starting as soon as lidocaine starts to wear off and take regularly for at least 48 hr to decrease discomfort.  Specifically to take with food or milk to decrease stomach upset and for IB 600 mg (3 tablets) every 6 hrs; IB 800 mg (4 tablets) every 8 hrs; or Aleve 2 tablets every 12 hrs.    2. Family planning  - etonogestrel (NEXPLANON) implant 68 mg  Due to language barrier, a Spanish interpreter Roddie Mc N.) was present in person during the history-taking, subsequent discussion, and physical exam with this patient.     Return if symptoms worsen or fail to improve.  No future appointments.  Lenice Llamas, [place]

## 2023-10-23 NOTE — Progress Notes (Signed)
 Pt is here for nexplanon insert. Condoms given. Gaspar Garbe, RN

## 2024-04-02 ENCOUNTER — Ambulatory Visit: Payer: Self-pay | Admitting: Nurse Practitioner

## 2024-04-02 ENCOUNTER — Ambulatory Visit: Payer: Self-pay

## 2024-04-02 DIAGNOSIS — N739 Female pelvic inflammatory disease, unspecified: Secondary | ICD-10-CM

## 2024-04-02 DIAGNOSIS — Z113 Encounter for screening for infections with a predominantly sexual mode of transmission: Secondary | ICD-10-CM

## 2024-04-02 LAB — HM HIV SCREENING LAB: HM HIV Screening: NEGATIVE

## 2024-04-02 MED ORDER — METRONIDAZOLE 500 MG PO TABS: 500.0000 mg | ORAL_TABLET | Freq: Two times a day (BID) | ORAL | Status: AC

## 2024-04-02 MED ORDER — CEFTRIAXONE SODIUM 500 MG IJ SOLR
500.0000 mg | Freq: Once | INTRAMUSCULAR | Status: AC
  Administered 2024-04-02 (×2): 500 mg via INTRAMUSCULAR

## 2024-04-02 MED ORDER — DOXYCYCLINE HYCLATE 100 MG PO TABS: 100.0000 mg | ORAL_TABLET | Freq: Two times a day (BID) | ORAL | Status: AC

## 2024-04-02 NOTE — Progress Notes (Signed)
 [facility] Department STI clinic [address] [place] [address] Main phone: [phone]  STI screening visit  Subjective:  Latoya Potter is a 28 y.o. female being seen today for an STI screening visit. The patient reports they do have symptoms.  Patient reports that they do not desire a pregnancy in the next year.   They reported they are not interested in discussing contraception today.    No LMP recorded. Patient has had an implant.  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 This week she started having dysuria  Does the patient using douching products? Yes. Pts mother told her to douche with vinegar and water mixture. This helped for a week but then symptoms returned.  See flowsheet for further details and programmatic requirements Hyperlink available at the top of the signed note in blue.  Flow sheet content below:  Pregnancy Intention Screening Does the patient want to become pregnant in the next year?: No Does the patient's partner want to become pregnant in the next year?: No Would the patient like to discuss contraceptive options today?: No All Patients Anyone smoke around pt and/or pt's children?: No Anyone smoke inside pt's house?: No Anyone smoke inside car?: No Anyone smoke inside the workplace?: No Reason For STD Screen STD Screening: Has symptoms Have you ever had an STD?: No History of Antibiotic use in the past 2 weeks?: No STD Symptoms Genital Itching: Yes Lower abdominal pain: Yes Discharge: Yes Dysuria: Yes (burns with urination) Genital ulcer / lesion: No Rash: No Vaginal irritation: Yes Oral / Other skin ulcer: No Pain with sex:  Yes Sore Throat: No Visual Changes: No Vaginal Bleeding: No Risk Factors for Hep B Household, sexual, or needle sharing contact of a person infected with Hep B: No Sexual contact with a person who uses drugs not as prescribed?: No Currently or Ever used drugs not as prescribed: No HIV Positive: No PRep Patient: No Men who have sex with men: No Have Hepatitis C: No History of Incarceration: No History of Homeslessness?: No Anal sex following anal drug use?: No Risk Factors for Hep C Currently using drugs not as prescribed: No Sexual partner(s) currently using drugs as not prescribed: No History of drug use: No HIV Positive: No People with a history of incarceration: No People born between the years of 33 and 57: No Assess # of cigarettes per day: 0 Is patient ready to quit in next 30 days?: Yes (vaping) Abuse History Has patient ever been abused physically?: No Has patient ever been abused sexually?: No Does patient feel they have a problem with Anxiety?: No Does patient feel they have a problem with Depression?: No Contraception Wrap Up Current Method: Hormonal Implant   Screening for MPX risk: Does the patient have an unexplained rash? No Is the patient MSM? No Does the patient endorse multiple sex partners or anonymous sex partners? No Did the patient have close or sexual contact with a person diagnosed with MPX? No Has the patient traveled outside the [place]  where MPX is endemic? No Is there a high clinical suspicion for MPX-- evidenced by one of the following No  -Unlikely to be chickenpox  -Lymphadenopathy  -Rash that present in same phase  of evolution on any given body part  Screenings: Last HIV test per patient/review of record was  Lab Results  Component Value Date   HMHIVSCREEN Negative - Validated 06/27/2023   No results found for: HIV   Last HEPC test per patient/review of record was No results found for: HMHEPCSCREEN No components found for: HEPC    Last HEPB test per patient/review of record was No components found for: HMHEPBSCREEN   Patient reports last pap was:   Lab Results  Component Value Date   SPECADGYN Comment 06/27/2023   Result Date Procedure Results Follow-ups  06/27/2023 IGP, rfx Aptima HPV ASCU DIAGNOSIS:: Comment Specimen adequacy:: Comment Clinician Provided ICD10: Comment Performed by:: Comment PAP Smear Comment: . Note:: Comment Test Methodology: Comment PAP Reflex: Comment   02/28/2020 Pap IG (Image Guided) DIAGNOSIS:: Comment Specimen adequacy:: Comment Clinician Provided ICD10: Comment Performed by:: Comment PAP Smear Comment: . Note:: Comment Test Methodology: Comment     Immunization history:  Immunization History  Administered Date(s) Administered   Influenza, Seasonal, Injecte, Preservative Fre 08/02/2021   Tdap 12/20/2021    The following portions of the patient's history were reviewed and updated as appropriate: allergies, current medications, past medical history, past social history, past surgical history and problem list.  Objective:  There were no vitals filed for this visit.  Physical Exam Vitals and nursing note reviewed. Exam conducted with a chaperone present Emmie).  Constitutional:      Appearance: Normal appearance.  HENT:     Head: Normocephalic and atraumatic.     Comments: Oral swab collected    Mouth/Throat:     Mouth: Mucous membranes are moist.     Pharynx: Oropharynx is clear. No oropharyngeal exudate or posterior oropharyngeal erythema.  Pulmonary:     Effort: Pulmonary effort is normal.  Abdominal:     General: Abdomen is flat.     Palpations: There is no mass.     Tenderness: There is no abdominal tenderness. There is no right CVA tenderness, left CVA tenderness or rebound.  Genitourinary:    General: Normal vulva.     Exam position: Lithotomy position.     Pubic Area: No rash or pubic lice.      Labia:        Right: No rash or lesion.        Left:  No rash or lesion.      Vagina: Erythema present. No vaginal discharge, bleeding or lesions.     Cervix: Cervical motion tenderness present. No discharge, friability, lesion or erythema.     Uterus: Tender.      Adnexa:        Right: Tenderness present.        Left: Tenderness present.      Rectum: Normal.     Comments: pH = 6 Lymphadenopathy:     Head:     Right side of head: No preauricular or posterior auricular adenopathy.     Left side of head: No preauricular or posterior auricular adenopathy.     Cervical: No cervical adenopathy.     Upper Body:     Right upper body: No supraclavicular, axillary or epitrochlear adenopathy.     Left upper body: No supraclavicular, axillary or epitrochlear adenopathy.     Lower Body: No right inguinal adenopathy. No left inguinal adenopathy.  Skin:    General: Skin is warm and dry.     Findings: No rash.  Neurological:     Mental Status: She is alert  and oriented to person, place, and time.  Psychiatric:        Mood and Affect: Mood normal.        Behavior: Behavior normal.      Assessment and Plan:  Suzy Abigail Bojorquez Figueroa is a 28 y.o. female presenting to the [facility] Department for STI screening  1. Screening for venereal disease (Primary) WP + clue cells, pH over 4 - Syphilis Serology, [place] [facility] - HIV [place] [facility] - Chlamydia/Gonorrhea [place] [facility] - WET PREP FOR TRICH, YEAST, CLUE - Gonococcus culture  2. Pelvic inflammatory disease Given dyspareunia and pain on exam will treat for PID. Advised pt to abstain from sex for 2 weeks and use a condom until her partner has tested negative for GC/CT.  - cefTRIAXone  (ROCEPHIN ) injection 500 mg - metroNIDAZOLE  (FLAGYL ) 500 MG tablet; Take 1 tablet (500 mg total) by mouth 2 (two) times daily for 14 days. - doxycycline  (VIBRA -TABS) 100 MG tablet; Take 1 tablet (100 mg total) by mouth 2 (two) times daily for 14 days.   Patient accepted the following  screenings: oral GC culture, vaginal CT/GC swab, vaginal wet prep, HIV, and RPR Patient meets criteria for HepB screening? No. Ordered? no Patient meets criteria for HepC screening? No. Ordered? no  Treat wet prep per standing order Discussed time line for [facility] results and that patient will be called with positive results and encouraged patient to call if she had not heard in 2 weeks.  Counseled to return or seek care for continued or worsening symptoms Recommended repeat testing in 3 months with positive results. Recommended condom use with all sex for STI prevention.   Patient is currently using *Nexplanon  to prevent pregnancy.    Due to language barrier, a Spanish interpreter Emmie SAILOR.) was present in person during the history-taking, subsequent discussion, and physical exam with this patient.   Return in about 2 weeks (around 04/16/2024) for follow up.  No future appointments.  Izzabelle Bouley K Delainy Mcelhiney, NP

## 2024-04-02 NOTE — Progress Notes (Addendum)
 The patient was dispensed Doxycycline  and Metronidazole  today. I provided counseling today regarding the medication. We discussed the medication, the side effects and when to call clinic. Patient given the opportunity to ask questions. Questions answered. Tolerated IM injection well. In house lab results reviewed by provider during visit. Consuelo Kingsley RN

## 2024-04-04 ENCOUNTER — Ambulatory Visit: Payer: Self-pay

## 2024-04-04 LAB — WET PREP FOR TRICH, YEAST, CLUE
Clue Cell Exam: POSITIVE — AB
Trichomonas Exam: NEGATIVE
Yeast Exam: NEGATIVE

## 2024-04-07 LAB — GONOCOCCUS CULTURE

## 2024-04-10 ENCOUNTER — Telehealth: Payer: Self-pay | Admitting: Family Medicine

## 2024-04-10 ENCOUNTER — Encounter: Payer: Self-pay | Admitting: Nurse Practitioner

## 2024-04-11 NOTE — Telephone Encounter (Signed)
 Larraine JONELLE Northern, RN

## 2024-04-12 ENCOUNTER — Ambulatory Visit: Payer: Self-pay

## 2024-04-12 DIAGNOSIS — B9689 Other specified bacterial agents as the cause of diseases classified elsewhere: Secondary | ICD-10-CM

## 2024-04-12 DIAGNOSIS — R102 Pelvic and perineal pain: Secondary | ICD-10-CM

## 2024-04-12 DIAGNOSIS — N898 Other specified noninflammatory disorders of vagina: Secondary | ICD-10-CM

## 2024-04-12 DIAGNOSIS — N760 Acute vaginitis: Secondary | ICD-10-CM

## 2024-04-12 LAB — WET PREP FOR TRICH, YEAST, CLUE
Clue Cell Exam: POSITIVE — AB
Trichomonas Exam: NEGATIVE
Yeast Exam: NEGATIVE

## 2024-04-12 MED ORDER — METRONIDAZOLE 0.75 % EX GEL: 1.0000 | Freq: Every day | CUTANEOUS | Status: AC

## 2024-04-12 NOTE — Progress Notes (Signed)
 [facility]  [facility]  Subjective:  Latoya Potter is a 28 y.o. being seen today for a follow up.  Chief Complaint  Patient presents with   SEXUALLY TRANSMITTED DISEASE   HPI Patient was seen on 04/02/24 and treated presumptively for PID due to pelvic pain, cervical motion tenderness. Her gonorrhea and chlamydia swabs were subsequently negative.   Patient reports she only took her metronidazole  and doxycycline  for 3 days after her visit on 04/02/24. She stopped because she felt chest pain on the left side when she took her medicine on day 2 and day 3, so she stopped. She also had abdominal discomfort.  Still having symptoms sometimes including lower abdominal pain with intercourse, bad odor from vagina. Clear vaginal discharge. Some days itching.  Today she is not having any symptoms. No fever. Sometimes has dysuria.  Had IUD placed that was removed 1 year ago, it was removed because it was in the wrong place. Feels her pain issues may have started with her misplaced IUD. Had Nexplanon  placed 2 months after her IUD was removed. No periods w/ Nexplanon . She has tried vaginal bath with vinegar, cream with coconut oil without much help.   Health Maintenance Due  Topic Date Due   HPV VACCINES (1 - 3-dose series) Never done   Hepatitis B Vaccines 19-59 Average Risk (1 of 3 - 19+ 3-dose series) Never done   COVID-19 Vaccine (1 - 2024-25 season) Never done   INFLUENZA VACCINE  03/22/2024   The following portions of the patient's history were reviewed and updated as appropriate: allergies, current medications, past family history, past medical history, past social history, past surgical history and problem list. Problem list updated.  See flowsheet for other program required questions.  Objective:  There were no vitals filed for this visit.  Physical Exam Vitals and nursing note reviewed.  Constitutional:       Appearance: Normal appearance.  HENT:     Head: Normocephalic.     Mouth/Throat:     Mouth: Mucous membranes are moist.  Cardiovascular:     Rate and Rhythm: Normal rate.  Pulmonary:     Effort: Pulmonary effort is normal.  Abdominal:     General: Abdomen is flat.     Palpations: Abdomen is soft. There is no mass.     Tenderness: There is abdominal tenderness.     Comments: Tenderness over mid portion of lower abdomen.  Genitourinary:    Comments: Self swabbed Musculoskeletal:        General: Normal range of motion.  Lymphadenopathy:     Head:     Right side of head: No submandibular, preauricular or posterior auricular adenopathy.     Left side of head: No submandibular, preauricular or posterior auricular adenopathy.     Cervical: No cervical adenopathy.     Upper Body:     Right upper body: No supraclavicular or axillary adenopathy.     Left upper body: No supraclavicular or axillary adenopathy.  Skin:    General: Skin is warm and dry.  Neurological:     Mental Status: She is alert and oriented to person, place, and time.  Psychiatric:        Mood and Affect: Mood normal.    Assessment and Plan:  Latoya Potter is a 28 y.o. female presenting to the [facility] Department for a Women's Health problem visit  1. Vaginal odor (Primary)  - Intermittent -  WET PREP FOR TRICH, YEAST, CLUE  2. Bacterial vaginosis  - Discussed she unlikely to have had PID given negative gonorrhea/chlamydia swabs - Wet prep positive for clue cells, given intermittent odor, vaginal discharge will treat for BV. - metroNIDAZOLE  (METROGEL ) 0.75 % gel; Apply 1 Application topically at bedtime for 5 days.   3. Pelvic pain  - Referral to [facility] for evaluation  Return if symptoms worsen or fail to improve.  No future appointments.  Damien FORBES Satchel, NP  Due to language barrier, a Spanish interpreter Kemp T.) was present in person during the history-taking,  subsequent discussion, and physical exam with this patient.

## 2024-04-12 NOTE — Progress Notes (Signed)
 Metrogel  given per provider order. The patient was dispensed #1 box metrogel  today. I provided counseling today regarding the medication. We discussed the medication, the side effects and when to call clinic. Patient given the opportunity to ask questions. Questions answered.  Larraine JONELLE Northern, RN
